# Patient Record
Sex: Female | Born: 1945 | Race: White | Hispanic: No | Smoking: Never smoker
Health system: Southern US, Community
[De-identification: ages and names within clinical notes are randomized; demographics above are authoritative.]

## PROBLEM LIST (undated history)

## (undated) DIAGNOSIS — I10 Essential (primary) hypertension: Secondary | ICD-10-CM

## (undated) DIAGNOSIS — E539 Vitamin B deficiency, unspecified: Secondary | ICD-10-CM

## (undated) DIAGNOSIS — K219 Gastro-esophageal reflux disease without esophagitis: Secondary | ICD-10-CM

## (undated) DIAGNOSIS — E119 Type 2 diabetes mellitus without complications: Secondary | ICD-10-CM

## (undated) DIAGNOSIS — G7000 Myasthenia gravis without (acute) exacerbation: Secondary | ICD-10-CM

## (undated) DIAGNOSIS — M4800 Spinal stenosis, site unspecified: Secondary | ICD-10-CM

## (undated) DIAGNOSIS — E785 Hyperlipidemia, unspecified: Secondary | ICD-10-CM

## (undated) DIAGNOSIS — R413 Other amnesia: Secondary | ICD-10-CM

## (undated) HISTORY — PX: CATARACT EXTRACTION: SUR2

---

## 2002-12-23 ENCOUNTER — Emergency Department (HOSPITAL_COMMUNITY): Admission: EM | Admit: 2002-12-23 | Discharge: 2002-12-23 | Payer: Self-pay

## 2003-01-21 ENCOUNTER — Encounter: Payer: Self-pay | Admitting: Diagnostic Radiology

## 2003-01-21 ENCOUNTER — Encounter: Admission: RE | Admit: 2003-01-21 | Discharge: 2003-01-21 | Payer: Self-pay | Admitting: Neurosurgery

## 2003-01-21 ENCOUNTER — Encounter: Payer: Self-pay | Admitting: Neurosurgery

## 2003-02-06 ENCOUNTER — Encounter: Payer: Self-pay | Admitting: Neurosurgery

## 2003-02-06 ENCOUNTER — Encounter: Admission: RE | Admit: 2003-02-06 | Discharge: 2003-02-06 | Payer: Self-pay | Admitting: Neurosurgery

## 2003-02-17 ENCOUNTER — Encounter: Payer: Self-pay | Admitting: Neurosurgery

## 2003-02-17 ENCOUNTER — Inpatient Hospital Stay (HOSPITAL_COMMUNITY): Admission: RE | Admit: 2003-02-17 | Discharge: 2003-02-20 | Payer: Self-pay | Admitting: Neurosurgery

## 2005-06-21 ENCOUNTER — Ambulatory Visit: Payer: Self-pay

## 2006-10-16 ENCOUNTER — Ambulatory Visit: Payer: Self-pay | Admitting: Anesthesiology

## 2008-02-20 ENCOUNTER — Ambulatory Visit: Payer: Self-pay | Admitting: Ophthalmology

## 2008-02-20 ENCOUNTER — Other Ambulatory Visit: Payer: Self-pay

## 2008-03-02 ENCOUNTER — Ambulatory Visit: Payer: Self-pay | Admitting: Ophthalmology

## 2010-04-06 ENCOUNTER — Ambulatory Visit: Payer: Self-pay | Admitting: Ophthalmology

## 2010-04-18 ENCOUNTER — Ambulatory Visit: Payer: Self-pay | Admitting: Ophthalmology

## 2012-01-01 ENCOUNTER — Emergency Department: Payer: Self-pay | Admitting: Emergency Medicine

## 2012-02-05 ENCOUNTER — Encounter: Payer: Self-pay | Admitting: Internal Medicine

## 2012-02-10 ENCOUNTER — Encounter: Payer: Self-pay | Admitting: Internal Medicine

## 2012-03-11 ENCOUNTER — Encounter: Payer: Self-pay | Admitting: Internal Medicine

## 2012-04-11 ENCOUNTER — Encounter: Payer: Self-pay | Admitting: Internal Medicine

## 2012-05-12 ENCOUNTER — Encounter: Payer: Self-pay | Admitting: Internal Medicine

## 2012-06-21 ENCOUNTER — Ambulatory Visit: Payer: Self-pay | Admitting: Internal Medicine

## 2012-06-21 DIAGNOSIS — Z0289 Encounter for other administrative examinations: Secondary | ICD-10-CM

## 2012-08-20 ENCOUNTER — Ambulatory Visit: Payer: Self-pay

## 2013-01-06 ENCOUNTER — Emergency Department: Payer: Self-pay | Admitting: Emergency Medicine

## 2013-01-06 LAB — URINALYSIS, COMPLETE
Bilirubin,UR: NEGATIVE
Blood: NEGATIVE
Ketone: NEGATIVE
Leukocyte Esterase: NEGATIVE
Nitrite: NEGATIVE
Ph: 8 (ref 4.5–8.0)
RBC,UR: 2 /HPF (ref 0–5)
Specific Gravity: 1.005 (ref 1.003–1.030)
Squamous Epithelial: 1

## 2013-01-06 LAB — MAGNESIUM: Magnesium: 1.2 mg/dL — ABNORMAL LOW

## 2013-01-06 LAB — CBC
HCT: 33.9 % — ABNORMAL LOW (ref 35.0–47.0)
HGB: 11.7 g/dL — ABNORMAL LOW (ref 12.0–16.0)
MCH: 29.2 pg (ref 26.0–34.0)
MCV: 85 fL (ref 80–100)
Platelet: 297 10*3/uL (ref 150–440)
WBC: 7.1 10*3/uL (ref 3.6–11.0)

## 2013-01-06 LAB — COMPREHENSIVE METABOLIC PANEL
Anion Gap: 8 (ref 7–16)
Calcium, Total: 9.1 mg/dL (ref 8.5–10.1)
Chloride: 98 mmol/L (ref 98–107)
Co2: 29 mmol/L (ref 21–32)
EGFR (African American): >60
EGFR (Non-African Amer.): >60
Glucose: 271 mg/dL — ABNORMAL HIGH (ref 65–99)
SGPT (ALT): 18 U/L (ref 12–78)
Sodium: 135 mmol/L — ABNORMAL LOW (ref 136–145)
Total Protein: 7.5 g/dL (ref 6.4–8.2)

## 2013-06-23 ENCOUNTER — Emergency Department: Payer: Self-pay | Admitting: Emergency Medicine

## 2013-06-23 LAB — BASIC METABOLIC PANEL
Anion Gap: 7 (ref 7–16)
Co2: 29 mmol/L (ref 21–32)
Creatinine: 0.87 mg/dL (ref 0.60–1.30)
EGFR (African American): >60
EGFR (Non-African Amer.): >60

## 2013-06-23 LAB — URINALYSIS, COMPLETE
Bacteria: NONE SEEN
Bilirubin,UR: NEGATIVE
Glucose,UR: >=500 mg/dL (ref 0–75)
Ph: 6 (ref 4.5–8.0)
RBC,UR: 1 /HPF (ref 0–5)
Specific Gravity: 1.009 (ref 1.003–1.030)
Squamous Epithelial: NONE SEEN
WBC UR: 1 /HPF (ref 0–5)

## 2013-06-23 LAB — CBC
HCT: 30.7 % — ABNORMAL LOW (ref 35.0–47.0)
RBC: 3.63 10*6/uL — ABNORMAL LOW (ref 3.80–5.20)
RDW: 13.4 % (ref 11.5–14.5)
WBC: 6.8 10*3/uL (ref 3.6–11.0)

## 2014-02-19 DIAGNOSIS — B351 Tinea unguium: Secondary | ICD-10-CM | POA: Insufficient documentation

## 2014-03-29 ENCOUNTER — Emergency Department: Payer: Self-pay | Admitting: Internal Medicine

## 2014-03-29 LAB — CBC WITH DIFFERENTIAL/PLATELET
Basophil #: 0 10*3/uL (ref 0.0–0.1)
Basophil %: 0.7 %
EOS PCT: 0.4 %
Eosinophil #: 0 10*3/uL (ref 0.0–0.7)
HCT: 36.4 % (ref 35.0–47.0)
HGB: 11.7 g/dL — AB (ref 12.0–16.0)
Lymphocyte #: 1.4 10*3/uL (ref 1.0–3.6)
Lymphocyte %: 20.9 %
MCH: 28.5 pg (ref 26.0–34.0)
MCHC: 32.1 g/dL (ref 32.0–36.0)
MCV: 89 fL (ref 80–100)
MONOS PCT: 13 %
Monocyte #: 0.9 x10 3/mm (ref 0.2–0.9)
Neutrophil #: 4.4 10*3/uL (ref 1.4–6.5)
Neutrophil %: 65 %
Platelet: 305 10*3/uL (ref 150–440)
RBC: 4.1 10*6/uL (ref 3.80–5.20)
RDW: 14.2 % (ref 11.5–14.5)
WBC: 6.8 10*3/uL (ref 3.6–11.0)

## 2014-03-29 LAB — COMPREHENSIVE METABOLIC PANEL
ALBUMIN: 3.6 g/dL (ref 3.4–5.0)
ALT: 18 U/L (ref 12–78)
ANION GAP: 12 (ref 7–16)
Alkaline Phosphatase: 66 U/L
BILIRUBIN TOTAL: 0.2 mg/dL (ref 0.2–1.0)
BUN: 12 mg/dL (ref 7–18)
CO2: 23 mmol/L (ref 21–32)
CREATININE: 1.1 mg/dL (ref 0.60–1.30)
Calcium, Total: 9.1 mg/dL (ref 8.5–10.1)
Chloride: 99 mmol/L (ref 98–107)
GFR CALC NON AF AMER: 52 — AB
Glucose: 133 mg/dL — ABNORMAL HIGH (ref 65–99)
Osmolality: 270 (ref 275–301)
Potassium: 4.2 mmol/L (ref 3.5–5.1)
SGOT(AST): 21 U/L (ref 15–37)
Sodium: 134 mmol/L — ABNORMAL LOW (ref 136–145)
Total Protein: 7.8 g/dL (ref 6.4–8.2)

## 2014-03-29 LAB — URINALYSIS, COMPLETE
BACTERIA: NONE SEEN
BLOOD: NEGATIVE
Bilirubin,UR: NEGATIVE
Glucose,UR: NEGATIVE mg/dL (ref 0–75)
KETONE: NEGATIVE
Nitrite: NEGATIVE
Ph: 5 (ref 4.5–8.0)
Protein: NEGATIVE
SPECIFIC GRAVITY: 1.017 (ref 1.003–1.030)
SQUAMOUS EPITHELIAL: NONE SEEN
WBC UR: 27 /HPF (ref 0–5)

## 2014-03-29 LAB — TROPONIN I: Troponin-I: <0.02 ng/mL

## 2014-04-20 ENCOUNTER — Emergency Department: Payer: Self-pay | Admitting: Student

## 2014-09-30 ENCOUNTER — Emergency Department: Payer: Self-pay | Admitting: Emergency Medicine

## 2014-09-30 LAB — COMPREHENSIVE METABOLIC PANEL
ALBUMIN: 3.4 g/dL (ref 3.4–5.0)
Alkaline Phosphatase: 60 U/L
Anion Gap: 10 (ref 7–16)
BILIRUBIN TOTAL: 0.2 mg/dL (ref 0.2–1.0)
BUN: 12 mg/dL (ref 7–18)
CALCIUM: 8.7 mg/dL (ref 8.5–10.1)
CREATININE: 0.96 mg/dL (ref 0.60–1.30)
Chloride: 100 mmol/L (ref 98–107)
Co2: 27 mmol/L (ref 21–32)
EGFR (African American): >60
EGFR (Non-African Amer.): >60
GLUCOSE: 114 mg/dL — AB (ref 65–99)
OSMOLALITY: 274 (ref 275–301)
Potassium: 3.7 mmol/L (ref 3.5–5.1)
SGOT(AST): 11 U/L — ABNORMAL LOW (ref 15–37)
SGPT (ALT): 14 U/L
Sodium: 137 mmol/L (ref 136–145)
Total Protein: 6.6 g/dL (ref 6.4–8.2)

## 2014-09-30 LAB — CBC
HCT: 31.5 % — ABNORMAL LOW (ref 35.0–47.0)
HGB: 10.2 g/dL — AB (ref 12.0–16.0)
MCH: 27.7 pg (ref 26.0–34.0)
MCHC: 32.6 g/dL (ref 32.0–36.0)
MCV: 85 fL (ref 80–100)
PLATELETS: 371 10*3/uL (ref 150–440)
RBC: 3.7 10*6/uL — ABNORMAL LOW (ref 3.80–5.20)
RDW: 14 % (ref 11.5–14.5)
WBC: 11.8 10*3/uL — AB (ref 3.6–11.0)

## 2014-09-30 LAB — URINALYSIS, COMPLETE
BILIRUBIN, UR: NEGATIVE
Bacteria: NONE SEEN
Blood: NEGATIVE
Glucose,UR: NEGATIVE mg/dL (ref 0–75)
LEUKOCYTE ESTERASE: NEGATIVE
Nitrite: NEGATIVE
PH: 7 (ref 4.5–8.0)
RBC, UR: NONE SEEN /HPF (ref 0–5)
Specific Gravity: 1.012 (ref 1.003–1.030)
Squamous Epithelial: <1
WBC UR: <1 /HPF (ref 0–5)

## 2014-11-03 ENCOUNTER — Ambulatory Visit: Payer: Self-pay

## 2015-04-15 ENCOUNTER — Emergency Department: Payer: Medicare Other

## 2015-04-15 ENCOUNTER — Encounter: Payer: Self-pay | Admitting: Emergency Medicine

## 2015-04-15 ENCOUNTER — Observation Stay
Admission: EM | Admit: 2015-04-15 | Discharge: 2015-04-16 | Disposition: A | Discharge status: Skilled Nursing Facility | Payer: Medicare Other | Attending: Internal Medicine | Admitting: Internal Medicine

## 2015-04-15 DIAGNOSIS — R4182 Altered mental status, unspecified: Secondary | ICD-10-CM | POA: Diagnosis not present

## 2015-04-15 DIAGNOSIS — M4800 Spinal stenosis, site unspecified: Secondary | ICD-10-CM | POA: Insufficient documentation

## 2015-04-15 DIAGNOSIS — I2510 Atherosclerotic heart disease of native coronary artery without angina pectoris: Secondary | ICD-10-CM | POA: Diagnosis not present

## 2015-04-15 DIAGNOSIS — E119 Type 2 diabetes mellitus without complications: Secondary | ICD-10-CM | POA: Diagnosis not present

## 2015-04-15 DIAGNOSIS — W010XXA Fall on same level from slipping, tripping and stumbling without subsequent striking against object, initial encounter: Secondary | ICD-10-CM | POA: Diagnosis not present

## 2015-04-15 DIAGNOSIS — Z981 Arthrodesis status: Secondary | ICD-10-CM | POA: Insufficient documentation

## 2015-04-15 DIAGNOSIS — S42002A Fracture of unspecified part of left clavicle, initial encounter for closed fracture: Secondary | ICD-10-CM | POA: Diagnosis present

## 2015-04-15 DIAGNOSIS — G94 Other disorders of brain in diseases classified elsewhere: Secondary | ICD-10-CM | POA: Insufficient documentation

## 2015-04-15 DIAGNOSIS — K219 Gastro-esophageal reflux disease without esophagitis: Secondary | ICD-10-CM | POA: Diagnosis not present

## 2015-04-15 DIAGNOSIS — M25512 Pain in left shoulder: Secondary | ICD-10-CM | POA: Diagnosis not present

## 2015-04-15 DIAGNOSIS — Z79899 Other long term (current) drug therapy: Secondary | ICD-10-CM | POA: Diagnosis not present

## 2015-04-15 DIAGNOSIS — E539 Vitamin B deficiency, unspecified: Secondary | ICD-10-CM | POA: Diagnosis not present

## 2015-04-15 DIAGNOSIS — R413 Other amnesia: Secondary | ICD-10-CM | POA: Insufficient documentation

## 2015-04-15 DIAGNOSIS — Z9851 Tubal ligation status: Secondary | ICD-10-CM | POA: Insufficient documentation

## 2015-04-15 DIAGNOSIS — Z794 Long term (current) use of insulin: Secondary | ICD-10-CM | POA: Diagnosis not present

## 2015-04-15 DIAGNOSIS — G7000 Myasthenia gravis without (acute) exacerbation: Secondary | ICD-10-CM | POA: Insufficient documentation

## 2015-04-15 DIAGNOSIS — M25511 Pain in right shoulder: Secondary | ICD-10-CM | POA: Insufficient documentation

## 2015-04-15 DIAGNOSIS — I739 Peripheral vascular disease, unspecified: Secondary | ICD-10-CM | POA: Diagnosis not present

## 2015-04-15 DIAGNOSIS — Z9849 Cataract extraction status, unspecified eye: Secondary | ICD-10-CM | POA: Diagnosis not present

## 2015-04-15 DIAGNOSIS — Y9289 Other specified places as the place of occurrence of the external cause: Secondary | ICD-10-CM | POA: Insufficient documentation

## 2015-04-15 DIAGNOSIS — N281 Cyst of kidney, acquired: Secondary | ICD-10-CM | POA: Insufficient documentation

## 2015-04-15 DIAGNOSIS — E785 Hyperlipidemia, unspecified: Secondary | ICD-10-CM | POA: Diagnosis not present

## 2015-04-15 DIAGNOSIS — R000 Tachycardia, unspecified: Secondary | ICD-10-CM | POA: Diagnosis not present

## 2015-04-15 DIAGNOSIS — S42022A Displaced fracture of shaft of left clavicle, initial encounter for closed fracture: Secondary | ICD-10-CM | POA: Diagnosis not present

## 2015-04-15 DIAGNOSIS — G9340 Encephalopathy, unspecified: Secondary | ICD-10-CM | POA: Diagnosis present

## 2015-04-15 DIAGNOSIS — I10 Essential (primary) hypertension: Secondary | ICD-10-CM | POA: Insufficient documentation

## 2015-04-15 DIAGNOSIS — S42211A Unspecified displaced fracture of surgical neck of right humerus, initial encounter for closed fracture: Secondary | ICD-10-CM | POA: Diagnosis not present

## 2015-04-15 DIAGNOSIS — Z7982 Long term (current) use of aspirin: Secondary | ICD-10-CM | POA: Insufficient documentation

## 2015-04-15 DIAGNOSIS — F0390 Unspecified dementia without behavioral disturbance: Secondary | ICD-10-CM | POA: Diagnosis not present

## 2015-04-15 DIAGNOSIS — I471 Supraventricular tachycardia: Secondary | ICD-10-CM | POA: Diagnosis present

## 2015-04-15 DIAGNOSIS — S42301A Unspecified fracture of shaft of humerus, right arm, initial encounter for closed fracture: Secondary | ICD-10-CM | POA: Diagnosis present

## 2015-04-15 HISTORY — DX: Gastro-esophageal reflux disease without esophagitis: K21.9

## 2015-04-15 HISTORY — DX: Spinal stenosis, site unspecified: M48.00

## 2015-04-15 HISTORY — DX: Myasthenia gravis without (acute) exacerbation: G70.00

## 2015-04-15 HISTORY — DX: Hyperlipidemia, unspecified: E78.5

## 2015-04-15 HISTORY — DX: Essential (primary) hypertension: I10

## 2015-04-15 HISTORY — DX: Type 2 diabetes mellitus without complications: E11.9

## 2015-04-15 HISTORY — DX: Other amnesia: R41.3

## 2015-04-15 HISTORY — DX: Vitamin B deficiency, unspecified: E53.9

## 2015-04-15 LAB — CBC WITH DIFFERENTIAL/PLATELET
BASOS ABS: 0 10*3/uL (ref 0–0.1)
Basophils Relative: 0 %
EOS ABS: 0 10*3/uL (ref 0–0.7)
Eosinophils Relative: 0 %
HEMATOCRIT: 30.4 % — AB (ref 35.0–47.0)
HEMOGLOBIN: 10.2 g/dL — AB (ref 12.0–16.0)
LYMPHS ABS: 0.7 10*3/uL — AB (ref 1.0–3.6)
Lymphocytes Relative: 5 %
MCH: 28.6 pg (ref 26.0–34.0)
MCHC: 33.5 g/dL (ref 32.0–36.0)
MCV: 85.3 fL (ref 80.0–100.0)
Monocytes Absolute: 0.9 10*3/uL (ref 0.2–0.9)
Monocytes Relative: 6 %
NEUTROS ABS: 12.1 10*3/uL — AB (ref 1.4–6.5)
NEUTROS PCT: 89 %
Platelets: 322 10*3/uL (ref 150–440)
RBC: 3.56 MIL/uL — AB (ref 3.80–5.20)
RDW: 15.5 % — ABNORMAL HIGH (ref 11.5–14.5)
WBC: 13.7 10*3/uL — ABNORMAL HIGH (ref 3.6–11.0)

## 2015-04-15 LAB — COMPREHENSIVE METABOLIC PANEL
ALT: 14 U/L (ref 14–54)
AST: 31 U/L (ref 15–41)
Albumin: 3.8 g/dL (ref 3.5–5.0)
Alkaline Phosphatase: 49 U/L (ref 38–126)
Anion gap: 14 (ref 5–15)
BUN: 17 mg/dL (ref 6–20)
CALCIUM: 9 mg/dL (ref 8.9–10.3)
CO2: 24 mmol/L (ref 22–32)
CREATININE: 0.73 mg/dL (ref 0.44–1.00)
Chloride: 96 mmol/L — ABNORMAL LOW (ref 101–111)
Glucose, Bld: 235 mg/dL — ABNORMAL HIGH (ref 65–99)
Potassium: 4.8 mmol/L (ref 3.5–5.1)
SODIUM: 134 mmol/L — AB (ref 135–145)
Total Bilirubin: 0.2 mg/dL — ABNORMAL LOW (ref 0.3–1.2)
Total Protein: 6.6 g/dL (ref 6.5–8.1)

## 2015-04-15 LAB — URINALYSIS COMPLETE WITH MICROSCOPIC (ARMC ONLY)
BACTERIA UA: NONE SEEN
BILIRUBIN URINE: NEGATIVE
Glucose, UA: >500 mg/dL — AB
Hgb urine dipstick: NEGATIVE
Leukocytes, UA: NEGATIVE
NITRITE: NEGATIVE
PH: 6 (ref 5.0–8.0)
PROTEIN: NEGATIVE mg/dL
SQUAMOUS EPITHELIAL / LPF: NONE SEEN
Specific Gravity, Urine: 1.016 (ref 1.005–1.030)

## 2015-04-15 LAB — TROPONIN I: Troponin I: <0.03 ng/mL (ref <0.031)

## 2015-04-15 LAB — TSH: TSH: 0.982 u[IU]/mL (ref 0.350–4.500)

## 2015-04-15 LAB — GLUCOSE, CAPILLARY: Glucose-Capillary: 192 mg/dL — ABNORMAL HIGH (ref 65–99)

## 2015-04-15 LAB — FIBRIN DERIVATIVES D-DIMER (ARMC ONLY): Fibrin derivatives D-dimer (ARMC): 5896 — ABNORMAL HIGH (ref 0–499)

## 2015-04-15 MED ORDER — ONDANSETRON HCL 4 MG/2ML IJ SOLN: 4.0000 mg | Freq: Four times a day (QID) | INTRAMUSCULAR | Status: DC | PRN

## 2015-04-15 MED ORDER — IOHEXOL 300 MG/ML  SOLN
100.0000 mL | Freq: Once | INTRAMUSCULAR | Status: AC | PRN
  Administered 2015-04-15: 100 mL via INTRAVENOUS

## 2015-04-15 MED ORDER — METFORMIN HCL 500 MG PO TABS
1000.0000 mg | ORAL_TABLET | Freq: Two times a day (BID) | ORAL | Status: DC
  Administered 2015-04-16: 1000 mg via ORAL
  Filled 2015-04-15: qty 2

## 2015-04-15 MED ORDER — LABETALOL HCL 5 MG/ML IV SOLN
10.0000 mg | INTRAVENOUS | Status: DC | PRN
  Filled 2015-04-15: qty 4

## 2015-04-15 MED ORDER — SODIUM CHLORIDE 0.9 % IV SOLN
INTRAVENOUS | Status: AC
  Administered 2015-04-16: via INTRAVENOUS

## 2015-04-15 MED ORDER — ACETAMINOPHEN 325 MG PO TABS
650.0000 mg | ORAL_TABLET | Freq: Four times a day (QID) | ORAL | Status: DC | PRN
  Administered 2015-04-16: 650 mg via ORAL
  Filled 2015-04-15: qty 2

## 2015-04-15 MED ORDER — MORPHINE SULFATE 2 MG/ML IJ SOLN
2.0000 mg | INTRAMUSCULAR | Status: DC | PRN
  Administered 2015-04-16 (×3): 2 mg via INTRAVENOUS
  Filled 2015-04-15 (×3): qty 1

## 2015-04-15 MED ORDER — ONDANSETRON HCL 4 MG PO TABS: 4.0000 mg | ORAL_TABLET | Freq: Four times a day (QID) | ORAL | Status: DC | PRN

## 2015-04-15 MED ORDER — IOHEXOL 350 MG/ML SOLN
75.0000 mL | Freq: Once | INTRAVENOUS | Status: AC | PRN
  Administered 2015-04-15: 75 mL via INTRAVENOUS

## 2015-04-15 MED ORDER — IOHEXOL 240 MG/ML SOLN: 25.0000 mL | Freq: Once | INTRAMUSCULAR | Status: DC | PRN

## 2015-04-15 MED ORDER — INSULIN ASPART 100 UNIT/ML ~~LOC~~ SOLN
0.0000 [IU] | Freq: Three times a day (TID) | SUBCUTANEOUS | Status: DC
  Administered 2015-04-16: 2 [IU] via SUBCUTANEOUS
  Administered 2015-04-16: 3 [IU] via SUBCUTANEOUS
  Filled 2015-04-15: qty 2
  Filled 2015-04-15: qty 3

## 2015-04-15 MED ORDER — LISINOPRIL 10 MG PO TABS
10.0000 mg | ORAL_TABLET | Freq: Every day | ORAL | Status: DC
  Administered 2015-04-16: 10 mg via ORAL
  Filled 2015-04-15: qty 1

## 2015-04-15 MED ORDER — MORPHINE SULFATE 2 MG/ML IJ SOLN
2.0000 mg | Freq: Once | INTRAMUSCULAR | Status: AC
  Administered 2015-04-15: 2 mg via INTRAVENOUS
  Filled 2015-04-15: qty 1

## 2015-04-15 MED ORDER — SODIUM CHLORIDE 0.9 % IV BOLUS (SEPSIS)
1000.0000 mL | Freq: Once | INTRAVENOUS | Status: AC
  Administered 2015-04-15: 1000 mL via INTRAVENOUS

## 2015-04-15 MED ORDER — MORPHINE SULFATE 4 MG/ML IJ SOLN
4.0000 mg | Freq: Once | INTRAMUSCULAR | Status: AC
Start: 2015-04-15 — End: 2015-04-15
  Administered 2015-04-15: 4 mg via INTRAVENOUS
  Filled 2015-04-15: qty 1

## 2015-04-15 MED ORDER — SODIUM CHLORIDE 0.9 % IJ SOLN: 3.0000 mL | Freq: Two times a day (BID) | INTRAMUSCULAR | Status: DC

## 2015-04-15 MED ORDER — INSULIN GLARGINE 100 UNIT/ML ~~LOC~~ SOLN
6.0000 [IU] | Freq: Every day | SUBCUTANEOUS | Status: DC
  Administered 2015-04-16: 6 [IU] via SUBCUTANEOUS
  Filled 2015-04-15 (×2): qty 0.06

## 2015-04-15 MED ORDER — ENOXAPARIN SODIUM 40 MG/0.4ML ~~LOC~~ SOLN
40.0000 mg | SUBCUTANEOUS | Status: DC
  Administered 2015-04-16: 40 mg via SUBCUTANEOUS
  Filled 2015-04-15: qty 0.4

## 2015-04-15 MED ORDER — CITALOPRAM HYDROBROMIDE 20 MG PO TABS
20.0000 mg | ORAL_TABLET | Freq: Every day | ORAL | Status: DC
  Administered 2015-04-16: 20 mg via ORAL
  Filled 2015-04-15: qty 1

## 2015-04-15 MED ORDER — ASPIRIN 81 MG PO CHEW
81.0000 mg | CHEWABLE_TABLET | Freq: Every day | ORAL | Status: DC
  Administered 2015-04-16: 81 mg via ORAL
  Filled 2015-04-15: qty 1

## 2015-04-15 MED ORDER — SODIUM CHLORIDE 0.9 % IV BOLUS (SEPSIS)
500.0000 mL | Freq: Once | INTRAVENOUS | Status: AC
  Administered 2015-04-15: 500 mL via INTRAVENOUS

## 2015-04-15 MED ORDER — ACETAMINOPHEN 650 MG RE SUPP: 650.0000 mg | Freq: Four times a day (QID) | RECTAL | Status: DC | PRN

## 2015-04-15 MED ORDER — DONEPEZIL HCL 5 MG PO TABS
5.0000 mg | ORAL_TABLET | Freq: Every day | ORAL | Status: DC
  Administered 2015-04-16: 5 mg via ORAL
  Filled 2015-04-15: qty 1

## 2015-04-15 MED ORDER — PANTOPRAZOLE SODIUM 40 MG PO TBEC
40.0000 mg | DELAYED_RELEASE_TABLET | Freq: Every day | ORAL | Status: DC
  Administered 2015-04-16: 40 mg via ORAL
  Filled 2015-04-15: qty 1

## 2015-04-15 NOTE — ED Provider Notes (Signed)
[facility] Emergency Department Provider Note  ____________________________________________  Time seen: Seen upon arrival to the emergency department  I have reviewed the triage vital signs and the nursing notes.   HISTORY  Chief Complaint Shoulder Injury    HPI Robin Archer is a 69 y.o. female with a history of memory losswho presents today with a confirmed left midclavicular fracture. EMS states that the patient started complaining of left shoulder pain after being moved today. The patient denies any fall. There was no history of a fall per EMS.   Past Medical History  Diagnosis Date  . Memory loss   . GERD (gastroesophageal reflux disease)   . Myasthenia gravis   . DM (diabetes mellitus)   . Hypertension   . Hyperlipidemia   . Spinal stenosis   . Vitamin B deficiency     There are no active problems to display for this patient.   History reviewed. No pertinent past surgical history.  Current Outpatient Rx  Name  Route  Sig  Dispense  Refill  . acetaminophen (TYLENOL) 325 MG tablet   Oral   Take 650 mg by mouth 3 (three) times daily.         Marland Kitchen alendronate (FOSAMAX) 70 MG tablet   Oral   Take 70 mg by mouth once a week. Pt takes on Tuesday.         Marland Kitchen aspirin 81 MG chewable tablet   Oral   Chew 81 mg by mouth daily.         . cholecalciferol (VITAMIN D) 1000 UNITS tablet   Oral   Take 1,000 Units by mouth daily.         . citalopram (CELEXA) 20 MG tablet   Oral   Take 20 mg by mouth daily.         Marland Kitchen donepezil (ARICEPT) 5 MG tablet   Oral   Take 5 mg by mouth at bedtime.         . ferrous sulfate 325 (65 FE) MG tablet   Oral   Take 325 mg by mouth 2 (two) times daily.         Marland Kitchen gabapentin (NEURONTIN) 300 MG capsule   Oral   Take 300 mg by mouth at bedtime.         . insulin glargine (LANTUS) 100 UNIT/ML injection   Subcutaneous   Inject 6 Units into the skin at bedtime.         . insulin lispro  (HUMALOG) 100 UNIT/ML injection   Subcutaneous   Inject 1-4 Units into the skin 2 (two) times daily. Pt uses four units in the morning before breakfast, two units before lunch, and one unit before dinner.         Marland Kitchen lisinopril (PRINIVIL,ZESTRIL) 10 MG tablet   Oral   Take 10 mg by mouth daily.         . magnesium oxide (MAG-OX) 400 MG tablet   Oral   Take 800 mg by mouth 2 (two) times daily.         . metFORMIN (GLUCOPHAGE) 1000 MG tablet   Oral   Take 1,000 mg by mouth 2 (two) times daily.         Marland Kitchen omeprazole (PRILOSEC) 20 MG capsule   Oral   Take 20 mg by mouth daily.         . vitamin B-12 (CYANOCOBALAMIN) 1000 MCG tablet   Oral   Take 1,000 mcg by mouth  every other day.         . vitamin C (ASCORBIC ACID) 500 MG tablet   Oral   Take 500 mg by mouth daily.           Allergies Review of patient's allergies indicates no known allergies.  History reviewed. No pertinent family history.  Social History History  Substance Use Topics  . Smoking status: Unknown If Ever Smoked  . Smokeless tobacco: Not on file  . Alcohol Use: No    Review of Systems Constitutional: No fever/chills Eyes: No visual changes. ENT: No sore throat. Cardiovascular: Denies chest pain. Respiratory: Denies shortness of breath. Gastrointestinal: No abdominal pain.  No nausea, no vomiting.  No diarrhea.  No constipation. Genitourinary: Negative for dysuria. Musculoskeletal: Negative for back pain. Skin: Negative for rash. Neurological: Negative for headaches, focal weakness or numbness.  10-point ROS otherwise negative.  ____________________________________________   PHYSICAL EXAM:  VITAL SIGNS: ED Triage Vitals  Enc Vitals Group     BP 04/15/15 1506 202/99 mmHg     Pulse Rate 04/15/15 1506 125     Resp 04/15/15 1506 14     Temp 04/15/15 1506 97.9 F (36.6 C)     Temp Source 04/15/15 1506 Oral     SpO2 04/15/15 1506 96 %     Weight 04/15/15 1506 130 lb 1.6 oz  (59.013 kg)     Height 04/15/15 1506  (1.626 m)     Head Cir --      Peak Flow --      Pain Score --      Pain Loc --      Pain Edu? --      Excl. in GC? --     Constitutional: Alert and oriented. Well appearing and in no acute distress. Eyes: Conjunctivae are normal. PERRL. EOMI. Head: Atraumatic. Nose: No congestion/rhinnorhea. Mouth/Throat: Mucous membranes are moist.  Oropharynx non-erythematous. Neck: No stridor.  No midline C-spine tenderness to palpation. No step-off or deformity of the cervical spine. Cardiovascular: Normal rate, regular rhythm. Grossly normal heart sounds.  Good peripheral circulation. Respiratory: Normal respiratory effort.  No retractions. Lungs CTAB. Gastrointestinal: Soft and nontender. No distention. No abdominal bruits. No CVA tenderness. Musculoskeletal: No lower extremity tenderness nor edema.  No joint effusions. No skin tenting over the left clavicle. No ecchymosis or deformity. Diffuse tenderness to the bilateral shoulders without any point tenderness. Neurologic:  Normal speech and language. No gross focal neurologic deficits are appreciated.  Skin:  Skin is warm, dry and intact. No rash noted. Psychiatric: Mood and affect are normal. Speech and behavior are normal.  ____________________________________________   LABS (all labs ordered are listed, but only abnormal results are displayed)  Labs Reviewed  CBC WITH DIFFERENTIAL/PLATELET - Abnormal; Notable for the following:    WBC 13.7 (*)    RBC 3.56 (*)    Hemoglobin 10.2 (*)    HCT 30.4 (*)    RDW 15.5 (*)    Neutro Abs 12.1 (*)    Lymphs Abs 0.7 (*)    All other components within normal limits  URINALYSIS COMPLETEWITH MICROSCOPIC ([facility] ONLY) - Abnormal; Notable for the following:    Color, Urine YELLOW (*)    APPearance CLEAR (*)    Glucose, UA >500 (*)    Ketones, ur 1+ (*)    All other components within normal limits  COMPREHENSIVE METABOLIC PANEL - Abnormal; Notable for the  following:    Sodium 134 (*)    Chloride  96 (*)    Glucose, Bld 235 (*)    Total Bilirubin 0.2 (*)    All other components within normal limits  FIBRIN DERIVATIVES D-DIMER ([facility] ONLY) - Abnormal; Notable for the following:    Fibrin derivatives D-dimer [facility]) 5896 (*)    All other components within normal limits  TROPONIN I  TSH   ____________________________________________  EKG  ED ECG REPORT I, Arelia Longest, the attending physician, personally viewed and interpreted this ECG.   Date: 04/15/2015  EKG Time: 1529  Rate: 116  Rhythm: sinus tachycardia  Axis: Normal axis  Intervals:none  ST&T Change: No ST elevations or depressions. No abnormal T-wave inversions.  ____________________________________________  RADIOLOGY   CT aNGO without any pulmonary embolus. Acute surgical neck fracture of the right humerus. No acute finding on the CAT scan of the abdomen and pelvis. CAT scan of the brain with atrophy and small vessel disease without any acute abnormality. CT of the cervical spine without any acute disease. Chest x-ray with a midshaft left clavicular fracture. I personally reviewed this chest x-ray. Pelvic x-ray without any acute abnormality. ____________________________________________   PROCEDURES    ____________________________________________   INITIAL IMPRESSION / ASSESSMENT AND PLAN / ED COURSE  Pertinent labs & imaging results that were available during my care of the patient were reviewed by me and considered in my medical decision making (see chart for details).  ----------------------------------------- 9:31 PM on 04/15/2015 ----------------------------------------- Family also saying now that the patient fell from her lift which is what caused the injury. Patient without any overt signs of pain. He is resting comfortably in the bed. However, heart rate is persistently in the 1 teens to 120s. It is maintaining this elevated rate despite the patient  getting one and 1/2 L of fluid. Family is at the bedside and says that the patient is also not acting at her baseline mental status. They say that she is usually more responsive and alert. Patient is protecting her airway as well as answering simple questions with yes no questions as well as nodding her head yes and no. We'll admit to the hospital for altered mental status. Signed out to Dr. Anne Hahn. Also discussed the fractures with Dr. Hyacinth Meeker of orthopedics who says that they do not require any emergent intervention. ____________________________________________   FINAL CLINICAL IMPRESSION(S) / ED DIAGNOSES  Acute left clavicular fracture. Acute right humeral fracture. Altered mental status. Persistent tachycardia. Initial visit.    Myrna Blazer, MD 04/15/15 2133

## 2015-04-15 NOTE — ED Notes (Addendum)
Pt presents from [facility] [place] with a confirmed L mid-clavicular fx per EMS. EMS states they did a portable x-ray of L shoudler after they were moving her today and she began to complain of shoulder pain. Pt is poor historian d/t memory loss.

## 2015-04-15 NOTE — H&P (Signed)
[facility] [place] [facility]   PATIENT NAME: Robin Archer    MR#:  [id number]  DATE OF BIRTH:  10-03-1945  DATE OF ADMISSION:  04/15/2015  PRIMARY CARE PHYSICIAN: Wynona Dove, MD   REQUESTING/REFERRING PHYSICIAN: Schaevitz  CHIEF COMPLAINT:   Chief Complaint  Patient presents with  . Shoulder Injury    HISTORY OF PRESENT ILLNESS:  Robin Archer  is a 69 y.o. female who presents with traumatic fractures from a fall. Patient is unable to give history, history is given by the nursing facility and her daughter who is present with her in the ED today. Per her daughter's report she was called by the nursing sleeve and the patient fell while being lifted with a lift device. The nursing home claims that the patient slipped from the handgrips and fell. In ED patient was found to have a right humeral fracture and a left clavicular fracture. She was also found to be somewhat encephalopathic. She is alert but unable to communicate to any significant degree, though she does follow commands and will respond to simple questions. The daughter states she has baseline dementia, but that this is significant change from her baseline functioning status. Patient is also remained persistently tachycardic and hypertensive in the ED. Hospitalists were called for admission for encephalopathy and persistent tachycardia.  PAST MEDICAL HISTORY:   Past Medical History  Diagnosis Date  . Memory loss   . GERD (gastroesophageal reflux disease)   . Myasthenia gravis   . DM (diabetes mellitus)   . Hypertension   . Hyperlipidemia   . Spinal stenosis   . Vitamin B deficiency     PAST SURGICAL HISTORY:   Past Surgical History  Procedure Laterality Date  . Cataract extraction      SOCIAL HISTORY:   History  Substance Use Topics  . Smoking status: Never Smoker   . Smokeless tobacco: Not on file  . Alcohol Use: No    FAMILY HISTORY:  History reviewed. No pertinent  family history.  DRUG ALLERGIES:  No Known Allergies  MEDICATIONS AT HOME:   Prior to Admission medications   Medication Sig Start Date End Date Taking? Authorizing Provider  acetaminophen (TYLENOL) 325 MG tablet Take 650 mg by mouth 3 (three) times daily.   Yes Historical Provider, MD  alendronate (FOSAMAX) 70 MG tablet Take 70 mg by mouth once a week. Pt takes on Tuesday.   Yes Historical Provider, MD  aspirin 81 MG chewable tablet Chew 81 mg by mouth daily.   Yes Historical Provider, MD  cholecalciferol (VITAMIN D) 1000 UNITS tablet Take 1,000 Units by mouth daily.   Yes Historical Provider, MD  citalopram (CELEXA) 20 MG tablet Take 20 mg by mouth daily.   Yes Historical Provider, MD  donepezil (ARICEPT) 5 MG tablet Take 5 mg by mouth at bedtime.   Yes Historical Provider, MD  ferrous sulfate 325 (65 FE) MG tablet Take 325 mg by mouth 2 (two) times daily.   Yes Historical Provider, MD  gabapentin (NEURONTIN) 300 MG capsule Take 300 mg by mouth at bedtime.   Yes Historical Provider, MD  insulin glargine (LANTUS) 100 UNIT/ML injection Inject 6 Units into the skin at bedtime.   Yes Historical Provider, MD  insulin lispro (HUMALOG) 100 UNIT/ML injection Inject 1-4 Units into the skin 2 (two) times daily. Pt uses four units in the morning before breakfast, two units before lunch, and one unit before dinner.   Yes Historical Provider, MD  lisinopril (PRINIVIL,ZESTRIL) 10 MG tablet Take 10 mg by mouth daily.   Yes Historical Provider, MD  magnesium oxide (MAG-OX) 400 MG tablet Take 800 mg by mouth 2 (two) times daily.   Yes Historical Provider, MD  metFORMIN (GLUCOPHAGE) 1000 MG tablet Take 1,000 mg by mouth 2 (two) times daily.   Yes Historical Provider, MD  omeprazole (PRILOSEC) 20 MG capsule Take 20 mg by mouth daily.   Yes Historical Provider, MD  vitamin B-12 (CYANOCOBALAMIN) 1000 MCG tablet Take 1,000 mcg by mouth every other day.   Yes Historical Provider, MD  vitamin C (ASCORBIC ACID) 500  MG tablet Take 500 mg by mouth daily.   Yes Historical Provider, MD    REVIEW OF SYSTEMS:  Review of Systems  Unable to perform ROS: dementia     VITAL SIGNS:   Filed Vitals:   04/15/15 1506 04/15/15 2232  BP: 202/99 189/81  Pulse: 125 125  Temp: 97.9 F (36.6 C)   TempSrc: Oral   Resp: 14 17  Height: 5\' 4"  (1.626 m)   Weight: 59.013 kg (130 lb 1.6 oz)   SpO2: 96% 97%   Wt Readings from Last 3 Encounters:  04/15/15 59.013 kg (130 lb 1.6 oz)    PHYSICAL EXAMINATION:  Physical Exam  Vitals reviewed. Constitutional: She is oriented to person, place, and time. She appears well-developed and well-nourished. No distress.  HENT:  Head: Normocephalic and atraumatic.  Mouth/Throat: Oropharynx is clear and moist.  Eyes: Conjunctivae and EOM are normal. Pupils are equal, round, and reactive to light. No scleral icterus.  Neck: Normal range of motion. Neck supple. No JVD present. No thyromegaly present.  Cardiovascular: Regular rhythm and intact distal pulses.  Exam reveals no gallop and no friction rub.   No murmur heard. Tachycardic  Respiratory: Effort normal and breath sounds normal. No respiratory distress. She has no wheezes. She has no rales.  GI: Soft. Bowel sounds are normal. She exhibits no distension. There is no tenderness.  Musculoskeletal: Normal range of motion. She exhibits no edema.  Left arm in sling, right arm with brace device in place. Right upper arm tender. No arthritis, no gout  Lymphadenopathy:    She has no cervical adenopathy.  Neurological: She is alert and oriented to person, place, and time. No cranial nerve deficit.  No dysarthria, no aphasia  Skin: Skin is warm and dry. No rash noted. No erythema.  Psychiatric: She has a normal mood and affect. Her behavior is normal. Judgment and thought content normal.    LABORATORY PANEL:   CBC  Recent Labs Lab 04/15/15 1549  WBC 13.7*  HGB 10.2*  HCT 30.4*  PLT 322    ------------------------------------------------------------------------------------------------------------------  Chemistries   Recent Labs Lab 04/15/15 1549  NA 134*  K 4.8  CL 96*  CO2 24  GLUCOSE 235*  BUN 17  CREATININE 0.73  CALCIUM 9.0  AST 31  ALT 14  ALKPHOS 49  BILITOT 0.2*   ------------------------------------------------------------------------------------------------------------------  Cardiac Enzymes  Recent Labs Lab 04/15/15 1549  TROPONINI <0.03   ------------------------------------------------------------------------------------------------------------------  RADIOLOGY:  Dg Chest 1 View  04/15/2015   CLINICAL DATA:  Possible fall.  Left clavicle fracture.  EXAM: CHEST  1 VIEW  COMPARISON:  01/06/2013  FINDINGS: The cardiomediastinal silhouette is unchanged and within normal limits. The lungs are mildly hypoinflated with minimal bibasilar atelectasis. No pleural effusion or pneumothorax is identified. There is a mildly-to-moderately displaced midshaft fracture of the left clavicle. There is also a mildly displaced right  humeral neck fracture.  IMPRESSION: 1. Midshaft left clavicle fracture. 2. Mildly displaced proximal right humerus fracture, incompletely evaluated. 3. Hypoinflation with minimal bibasilar atelectasis.   Electronically Signed   By: Sebastian Ache   On: 04/15/2015 16:09   Dg Pelvis 1-2 Views  04/15/2015   CLINICAL DATA:  Possible fall. Poor historian. Possible clavicle fracture.  EXAM: PELVIS - 1-2 VIEW  COMPARISON:  None.  FINDINGS: There is no evidence of pelvic fracture or diastasis. No pelvic bone lesions are seen.  Previous lumbar fusion. Bilateral tubal ligation clips are present. Bones appear osteopenic. Regional bowel gas pattern is nonobstructive.  IMPRESSION: No evidence for acute  abnormality.   Electronically Signed   By: Norva Pavlov M.D.   On: 04/15/2015 16:07   Ct Head Wo Contrast  04/15/2015   CLINICAL DATA:  The patient is  from [facility] [place] with a confirmed L mid-clavicular fx per EMS. EMS states they did a portable x-ray of L shoulder after they were moving her today and she began to complain of shoulder pain. Pt is poor historian due to memory loss.  EXAM: CT HEAD WITHOUT CONTRAST  CT CERVICAL SPINE WITHOUT CONTRAST  TECHNIQUE: Multidetector CT imaging of the head and cervical spine was performed following the standard protocol without intravenous contrast. Multiplanar CT image reconstructions of the cervical spine were also generated.  COMPARISON:  11/03/2014  FINDINGS: CT HEAD FINDINGS  There is moderate central and cortical atrophy. Periventricular white matter changes are consistent with small vessel disease. There is no intra or extra-axial fluid collection or mass lesion. The basilar cisterns and ventricles have a normal appearance. There is no CT evidence for acute infarction or hemorrhage. Bone windows show no acute fractures. There is mild edema in the left frontal scalp region. No underlying fracture.  CT CERVICAL SPINE FINDINGS  There are significant degenerative changes throughout the cervical spine. There are no acute cervical fractures or traumatic subluxation. Lung apices are clear. The visualized portion of the thyroid gland has a normal appearance. Coarse ligamentum flavum calcifications are again noted at C4 and C5. There is mild narrowing of the thecal sac at these levels, stable in appearance.  There is a fracture of the left clavicle with inferior displacement of the distal fragment. Upper posterior ribs are intact.  IMPRESSION: 1. Atrophy and small vessel disease. 2.  No evidence for acute  abnormality. 3. Left frontal scalp edema without underlying fracture. 4. Significant degenerative changes in the cervical spine. 5. Left clavicle fracture.   Electronically Signed   By: Norva Pavlov M.D.   On: 04/15/2015 16:48   Ct Angio Chest Pe W/cm &/or Wo Cm  04/15/2015   CLINICAL DATA:   Tachycardia and elevated D-dimer today. Acute right humerus and left clavicle fractures. Initial encounter.  EXAM: CT ANGIOGRAPHY CHEST WITH CONTRAST  TECHNIQUE: Multidetector CT imaging of the chest was performed using the standard protocol during bolus administration of intravenous contrast. Multiplanar CT image reconstructions and MIPs were obtained to evaluate the vascular anatomy.  CONTRAST:  75 mL OMNIPAQUE IOHEXOL 350 MG/ML SOLN  COMPARISON:  Plain film of the chest earlier today.  FINDINGS: No pulmonary embolus is identified. Calcific aortic and coronary atherosclerosis is noted. Heart size is upper normal. No pleural or pericardial effusion. No axillary, hilar or mediastinal lymphadenopathy. The lungs demonstrate mild dependent atelectasis.  Incidentally imaged upper abdomen is unremarkable. Surgical neck fracture right humerus and diaphyseal fracture left clavicle are identified. No other fracture is seen.  Review of the MIP images confirms the above findings.  IMPRESSION: Negative for pulmonary embolus or acute cardiopulmonary disease.  Acute surgical neck fracture right humerus and mid diaphyseal fracture left clavicle.  Calcific aortic and coronary atherosclerosis.   Electronically Signed   By: Drusilla Kanner M.D.   On: 04/15/2015 20:54   Ct Cervical Spine Wo Contrast  04/15/2015   CLINICAL DATA:  The patient is from [facility] [place] with a confirmed L mid-clavicular fx per EMS. EMS states they did a portable x-ray of L shoulder after they were moving her today and she began to complain of shoulder pain. Pt is poor historian due to memory loss.  EXAM: CT HEAD WITHOUT CONTRAST  CT CERVICAL SPINE WITHOUT CONTRAST  TECHNIQUE: Multidetector CT imaging of the head and cervical spine was performed following the standard protocol without intravenous contrast. Multiplanar CT image reconstructions of the cervical spine were also generated.  COMPARISON:  11/03/2014  FINDINGS: CT HEAD FINDINGS   There is moderate central and cortical atrophy. Periventricular white matter changes are consistent with small vessel disease. There is no intra or extra-axial fluid collection or mass lesion. The basilar cisterns and ventricles have a normal appearance. There is no CT evidence for acute infarction or hemorrhage. Bone windows show no acute fractures. There is mild edema in the left frontal scalp region. No underlying fracture.  CT CERVICAL SPINE FINDINGS  There are significant degenerative changes throughout the cervical spine. There are no acute cervical fractures or traumatic subluxation. Lung apices are clear. The visualized portion of the thyroid gland has a normal appearance. Coarse ligamentum flavum calcifications are again noted at C4 and C5. There is mild narrowing of the thecal sac at these levels, stable in appearance.  There is a fracture of the left clavicle with inferior displacement of the distal fragment. Upper posterior ribs are intact.  IMPRESSION: 1. Atrophy and small vessel disease. 2.  No evidence for acute  abnormality. 3. Left frontal scalp edema without underlying fracture. 4. Significant degenerative changes in the cervical spine. 5. Left clavicle fracture.   Electronically Signed   By: Norva Pavlov M.D.   On: 04/15/2015 16:48   Ct Abdomen Pelvis W Contrast  04/15/2015   CLINICAL DATA:  Trauma.  Left clavicle fracture.  Initial encounter.  EXAM: CT ABDOMEN AND PELVIS WITH CONTRAST  TECHNIQUE: Multidetector CT imaging of the abdomen and pelvis was performed using the standard protocol following bolus administration of intravenous contrast.  CONTRAST:  100 mL OMNIPAQUE IOHEXOL 300 MG/ML  SOLN  COMPARISON:  None.  FINDINGS: Mild dependent atelectasis is seen lung bases. Heart size is normal. No pleural or pericardial effusion.  The gallbladder, spleen, adrenal glands and pancreas are unremarkable. Small parapelvic renal cysts are noted. A few small calcifications are identified in the  liver consistent with old granulomatous disease.  The patient is status post tubal ligation. The uterus and urinary bladder are unremarkable. The stomach, small and large bowel and appendix appear normal. No lymphadenopathy or fluid is identified.  No fracture is seen. No lytic or sclerotic bony lesion is noted. The patient is status post lower lumbar fusion. Multilevel degenerative disc disease is seen.  IMPRESSION: No acute finding abdomen or pelvis.  Incidental findings as above.   Electronically Signed   By: Drusilla Kanner M.D.   On: 04/15/2015 17:11   Dg Humerus Right  04/15/2015   CLINICAL DATA:  Left clavicle fracture diagnosed today. The patient is unable to provide history.  Initial encounter.  EXAM: RIGHT HUMERUS - 2+ VIEW  COMPARISON:  None.  FINDINGS: There is an acute surgical neck fracture of the right humerus. No other acute bony or joint abnormality is identified. Imaged right lung and ribs are unremarkable.  IMPRESSION: Acute surgical neck fracture right humerus.   Electronically Signed   By: Drusilla Kanner M.D.   On: 04/15/2015 17:16    EKG:   Orders placed or performed during the hospital encounter of 04/15/15  . ED EKG  . ED EKG  . ED EKG  . ED EKG  . EKG 12-Lead  . EKG 12-Lead    IMPRESSION AND PLAN:  Principal Problem:   Acute encephalopathy - likely due to acute stress from fractures with minimal baseline mental reserve in setting of dementia. Patient CT scan including CT head was negative. Active Problems:   Right humeral fracture - stabilized in the ED, patient is to follow-up with orthopedics in the outpatient setting. We'll ensure pain control.   Fracture of left clavicle - left arm in sling, treatment as above.   Tachycardia - likely due to pain from her fractures that she is unable to properly communicate.  Will get adequate pain control on board and monitor her vital signs for improvement. We will give her gentle hydration as well to see if that else.   HTN  (hypertension) - as above, likely due to pain, treated as above, continue home antihypertensives, when necessary IV antihypertensives as well for blood pressure goal less than 160/100   Dementia - home meds   Myasthenia gravis - chronic condition, generally stable, felt to be the cause of the weakness which may have led to her fall.   Type II diabetes mellitus - carb modified diet, sliding scale insulin with appropriate glucose checks   GERD (gastroesophageal reflux disease) - PPI while here  All the records are reviewed and case discussed with ED provider. Management plans discussed with the patient and/or family.  DVT PROPHYLAXIS: SubQ lovenox  ADMISSION STATUS: Observation  CODE STATUS: Full  TOTAL TIME TAKING CARE OF THIS PATIENT: 45 minutes.    Leonette Tischer FIELDING 04/15/2015, 10:49 PM  [facility]  Office  [phone]  CC: Primary care physician; Wynona Dove, MD

## 2015-04-15 NOTE — ED Notes (Signed)
Received bedside report from [place], [place].

## 2015-04-16 ENCOUNTER — Telehealth: Payer: Self-pay | Admitting: Internal Medicine

## 2015-04-16 ENCOUNTER — Telehealth: Payer: Self-pay

## 2015-04-16 LAB — BASIC METABOLIC PANEL
ANION GAP: 8 (ref 5–15)
BUN: 13 mg/dL (ref 6–20)
CALCIUM: 8.3 mg/dL — AB (ref 8.9–10.3)
CO2: 27 mmol/L (ref 22–32)
Chloride: 101 mmol/L (ref 101–111)
Creatinine, Ser: 0.6 mg/dL (ref 0.44–1.00)
GFR calc Af Amer: >60 mL/min (ref >60)
GFR calc non Af Amer: >60 mL/min (ref >60)
Glucose, Bld: 177 mg/dL — ABNORMAL HIGH (ref 65–99)
Potassium: 4.1 mmol/L (ref 3.5–5.1)
SODIUM: 136 mmol/L (ref 135–145)

## 2015-04-16 LAB — GLUCOSE, CAPILLARY
GLUCOSE-CAPILLARY: 176 mg/dL — AB (ref 65–99)
GLUCOSE-CAPILLARY: 203 mg/dL — AB (ref 65–99)

## 2015-04-16 LAB — CBC
HEMATOCRIT: 25.5 % — AB (ref 35.0–47.0)
Hemoglobin: 8.9 g/dL — ABNORMAL LOW (ref 12.0–16.0)
MCH: 29.6 pg (ref 26.0–34.0)
MCHC: 34.8 g/dL (ref 32.0–36.0)
MCV: 85.1 fL (ref 80.0–100.0)
PLATELETS: 269 10*3/uL (ref 150–440)
RBC: 3 MIL/uL — ABNORMAL LOW (ref 3.80–5.20)
RDW: 15.3 % — ABNORMAL HIGH (ref 11.5–14.5)
WBC: 7.9 10*3/uL (ref 3.6–11.0)

## 2015-04-16 LAB — HEMOGLOBIN A1C: Hgb A1c MFr Bld: 6.2 % — ABNORMAL HIGH (ref 4.0–6.0)

## 2015-04-16 MED ORDER — HYDROCODONE-ACETAMINOPHEN 5-325 MG PO TABS: 1.0000 | ORAL_TABLET | Freq: Four times a day (QID) | ORAL | Status: DC | PRN

## 2015-04-16 MED ORDER — METOPROLOL TARTRATE 25 MG PO TABS: 25.0000 mg | ORAL_TABLET | Freq: Two times a day (BID) | ORAL | Status: DC

## 2015-04-16 MED ORDER — METOPROLOL TARTRATE 25 MG PO TABS
25.0000 mg | ORAL_TABLET | Freq: Two times a day (BID) | ORAL | Status: DC
  Administered 2015-04-16: 25 mg via ORAL
  Filled 2015-04-16: qty 1

## 2015-04-16 NOTE — Discharge Summary (Addendum)
[facility] - [employer] at [facility]   PATIENT NAME: Robin Archer    MR#:  [id number]  DATE OF BIRTH:  01-27-1946  DATE OF ADMISSION:  04/15/2015 ADMITTING PHYSICIAN: Oralia Manis, MD  DATE OF DISCHARGE: 04/16/2015  PRIMARY CARE PHYSICIAN: Wynona Dove, MD    ADMISSION DIAGNOSIS:  Sinus tachycardia [I47.1] Clavicle fracture, left, closed, initial encounter [S42.002A] Humerus fracture, right, closed, initial encounter [S42.301A] Altered mental status, unspecified altered mental status type [R41.82]  DISCHARGE DIAGNOSIS:  Principal Problem:   Acute encephalopathy Active Problems:   Dementia   Right humeral fracture   Fracture of left clavicle   Tachycardia   HTN (hypertension)   Myasthenia gravis   Type II diabetes mellitus   GERD (gastroesophageal reflux disease)   SECONDARY DIAGNOSIS:   Past Medical History  Diagnosis Date  . Memory loss   . GERD (gastroesophageal reflux disease)   . Myasthenia gravis   . DM (diabetes mellitus)   . Hypertension   . Hyperlipidemia   . Spinal stenosis   . Vitamin B deficiency     HOSPITAL COURSE:  69 year old female with dementia and myasthenia gravis who presented from a nursing home after the nursing home claiming that the patient slipped from the handgrips and fell. She was found to have a right humeral fracture and a left clavicular fracture. For further details please further H&P.  1. Acute encephalopathy: This is due to acute stress from her fractures with underlying dementia. Patient appears to be back to baseline. Patient underwent a CT scan of the head which showed no acute etiology.  2. Right humeral fracture: Patient was stabilized in the emergency department. Patient will need to follow up with orthopedics as an outpatient. Patient will require pain medications.  3. Fracture of left clavicle: Patient is a left arm sitting. Treatment as stated above.  4. Sinus tachycardia: This is  secondary to her fractures and pain. Continue pain management. She underwent CT chest to evaluate for pulmonary emboli which was negative for pulmonary emboli. I added metoprolol to her regimen as her blood pressure was elevated as well as her tachycardia. This should control her heart rate better.   5. Essential hypertension: Patient's blood pressure was elevated due to pain. Patient will continue her home hypertensive medications and pain control. We also added metoprolol.  6. Dementia: Continue Aricept 7. Diabetes: Patient will continue her outpatient medication regimen and ADA diet.   DISCHARGE CONDITIONS AND DIET:  Regular/diabetic diet Patient's being discharged in stable condition  CONSULTS OBTAINED:  Treatment Team:  Juanell Fairly, MD  DRUG ALLERGIES:  No Known Allergies  DISCHARGE MEDICATIONS:   Current Discharge Medication List    START taking these medications   Details  HYDROcodone-acetaminophen (NORCO) 5-325 MG per tablet Take 1 tablet by mouth every 6 (six) hours as needed for moderate pain. Qty: 30 tablet, Refills: 0      CONTINUE these medications which have NOT CHANGED   Details  acetaminophen (TYLENOL) 325 MG tablet Take 650 mg by mouth 3 (three) times daily.    alendronate (FOSAMAX) 70 MG tablet Take 70 mg by mouth once a week. Pt takes on Tuesday.    aspirin 81 MG chewable tablet Chew 81 mg by mouth daily.    cholecalciferol (VITAMIN D) 1000 UNITS tablet Take 1,000 Units by mouth daily.    citalopram (CELEXA) 20 MG tablet Take 20 mg by mouth daily.    donepezil (ARICEPT) 5 MG tablet Take 5 mg by mouth  at bedtime.    ferrous sulfate 325 (65 FE) MG tablet Take 325 mg by mouth 2 (two) times daily.    gabapentin (NEURONTIN) 300 MG capsule Take 300 mg by mouth at bedtime.    insulin glargine (LANTUS) 100 UNIT/ML injection Inject 6 Units into the skin at bedtime.    insulin lispro (HUMALOG) 100 UNIT/ML injection Inject 1-4 Units into the skin 2 (two)  times daily. Pt uses four units in the morning before breakfast, two units before lunch, and one unit before dinner.    lisinopril (PRINIVIL,ZESTRIL) 10 MG tablet Take 10 mg by mouth daily.    magnesium oxide (MAG-OX) 400 MG tablet Take 800 mg by mouth 2 (two) times daily.    metFORMIN (GLUCOPHAGE) 1000 MG tablet Take 1,000 mg by mouth 2 (two) times daily.    omeprazole (PRILOSEC) 20 MG capsule Take 20 mg by mouth daily.    vitamin B-12 (CYANOCOBALAMIN) 1000 MCG tablet Take 1,000 mcg by mouth every other day.    vitamin C (ASCORBIC ACID) 500 MG tablet Take 500 mg by mouth daily.       Metoprolol 25 mg by mouth twice a day       Today   CHIEF COMPLAINT:  Patient has dementia and only follows very simple commands. She is at her baseline.   VITAL SIGNS:  Blood pressure 162/69, pulse 119, temperature 98.6 F (37 C), temperature source Oral, resp. rate 16, height 5\' 4"  (1.626 m), weight 59.013 kg (130 lb 1.6 oz), SpO2 98 %.   REVIEW OF SYSTEMS:  Review of Systems  Unable to perform ROS: dementia     PHYSICAL EXAMINATION:  GENERAL:  69 y.o.-year-old patient lying in the bed with no acute distress.  NECK:  Supple, no jugular venous distention. No thyroid enlargement, no tenderness.  LUNGS: Normal breath sounds bilaterally, no wheezing, rales,rhonchi  No use of accessory muscles of respiration.  CARDIOVASCULAR: Tachycardia. No murmurs, rubs, or gallops.  ABDOMEN: Soft, non-tender, non-distended. Bowel sounds present. No organomegaly or mass.  EXTREMITIES: No pedal edema, cyanosis, or clubbing. She is in a sling PSYCHIATRIC: The patient is alert she has dementia, severe  SKIN: No obvious rash, lesion, or ulcer.   DATA REVIEW:   CBC  Recent Labs Lab 04/16/15 0516  WBC 7.9  HGB 8.9*  HCT 25.5*  PLT 269    Chemistries   Recent Labs Lab 04/15/15 1549 04/16/15 0516  NA 134* 136  K 4.8 4.1  CL 96* 101  CO2 24 27  GLUCOSE 235* 177*  BUN 17 13  CREATININE  0.73 0.60  CALCIUM 9.0 8.3*  AST 31  --   ALT 14  --   ALKPHOS 49  --   BILITOT 0.2*  --     Cardiac Enzymes  Recent Labs Lab 04/15/15 1549  TROPONINI <0.03    Microbiology Results  @[email or web address] @  RADIOLOGY:  Dg Chest 1 View  04/15/2015   CLINICAL DATA:  Possible fall.  Left clavicle fracture.  EXAM: CHEST  1 VIEW  COMPARISON:  01/06/2013  FINDINGS: The cardiomediastinal silhouette is unchanged and within normal limits. The lungs are mildly hypoinflated with minimal bibasilar atelectasis. No pleural effusion or pneumothorax is identified. There is a mildly-to-moderately displaced midshaft fracture of the left clavicle. There is also a mildly displaced right humeral neck fracture.  IMPRESSION: 1. Midshaft left clavicle fracture. 2. Mildly displaced proximal right humerus fracture, incompletely evaluated. 3. Hypoinflation with minimal bibasilar atelectasis.   Electronically Signed  By: Sebastian Ache   On: 04/15/2015 16:09   Dg Pelvis 1-2 Views  04/15/2015   CLINICAL DATA:  Possible fall. Poor historian. Possible clavicle fracture.  EXAM: PELVIS - 1-2 VIEW  COMPARISON:  None.  FINDINGS: There is no evidence of pelvic fracture or diastasis. No pelvic bone lesions are seen.  Previous lumbar fusion. Bilateral tubal ligation clips are present. Bones appear osteopenic. Regional bowel gas pattern is nonobstructive.  IMPRESSION: No evidence for acute  abnormality.   Electronically Signed   By: Norva Pavlov M.D.   On: 04/15/2015 16:07   Ct Head Wo Contrast  04/15/2015   CLINICAL DATA:  The patient is from [facility] at [place] with a confirmed L mid-clavicular fx per EMS. EMS states they did a portable x-ray of L shoulder after they were moving her today and she began to complain of shoulder pain. Pt is poor historian due to memory loss.  EXAM: CT HEAD WITHOUT CONTRAST  CT CERVICAL SPINE WITHOUT CONTRAST  TECHNIQUE: Multidetector CT imaging of the head and cervical spine was performed  following the standard protocol without intravenous contrast. Multiplanar CT image reconstructions of the cervical spine were also generated.  COMPARISON:  11/03/2014  FINDINGS: CT HEAD FINDINGS  There is moderate central and cortical atrophy. Periventricular white matter changes are consistent with small vessel disease. There is no intra or extra-axial fluid collection or mass lesion. The basilar cisterns and ventricles have a normal appearance. There is no CT evidence for acute infarction or hemorrhage. Bone windows show no acute fractures. There is mild edema in the left frontal scalp region. No underlying fracture.  CT CERVICAL SPINE FINDINGS  There are significant degenerative changes throughout the cervical spine. There are no acute cervical fractures or traumatic subluxation. Lung apices are clear. The visualized portion of the thyroid gland has a normal appearance. Coarse ligamentum flavum calcifications are again noted at C4 and C5. There is mild narrowing of the thecal sac at these levels, stable in appearance.  There is a fracture of the left clavicle with inferior displacement of the distal fragment. Upper posterior ribs are intact.  IMPRESSION: 1. Atrophy and small vessel disease. 2.  No evidence for acute  abnormality. 3. Left frontal scalp edema without underlying fracture. 4. Significant degenerative changes in the cervical spine. 5. Left clavicle fracture.   Electronically Signed   By: Norva Pavlov M.D.   On: 04/15/2015 16:48   Ct Angio Chest Pe W/cm &/or Wo Cm  04/15/2015   CLINICAL DATA:  Tachycardia and elevated D-dimer today. Acute right humerus and left clavicle fractures. Initial encounter.  EXAM: CT ANGIOGRAPHY CHEST WITH CONTRAST  TECHNIQUE: Multidetector CT imaging of the chest was performed using the standard protocol during bolus administration of intravenous contrast. Multiplanar CT image reconstructions and MIPs were obtained to evaluate the vascular anatomy.  CONTRAST:  75 mL  OMNIPAQUE IOHEXOL 350 MG/ML SOLN  COMPARISON:  Plain film of the chest earlier today.  FINDINGS: No pulmonary embolus is identified. Calcific aortic and coronary atherosclerosis is noted. Heart size is upper normal. No pleural or pericardial effusion. No axillary, hilar or mediastinal lymphadenopathy. The lungs demonstrate mild dependent atelectasis.  Incidentally imaged upper abdomen is unremarkable. Surgical neck fracture right humerus and diaphyseal fracture left clavicle are identified. No other fracture is seen.  Review of the MIP images confirms the above findings.  IMPRESSION: Negative for pulmonary embolus or acute cardiopulmonary disease.  Acute surgical neck fracture right humerus and mid diaphyseal fracture  left clavicle.  Calcific aortic and coronary atherosclerosis.   Electronically Signed   By: Drusilla Kanner M.D.   On: 04/15/2015 20:54   Ct Cervical Spine Wo Contrast  04/15/2015   CLINICAL DATA:  The patient is from [facility] [place] with a confirmed L mid-clavicular fx per EMS. EMS states they did a portable x-ray of L shoulder after they were moving her today and she began to complain of shoulder pain. Pt is poor historian due to memory loss.  EXAM: CT HEAD WITHOUT CONTRAST  CT CERVICAL SPINE WITHOUT CONTRAST  TECHNIQUE: Multidetector CT imaging of the head and cervical spine was performed following the standard protocol without intravenous contrast. Multiplanar CT image reconstructions of the cervical spine were also generated.  COMPARISON:  11/03/2014  FINDINGS: CT HEAD FINDINGS  There is moderate central and cortical atrophy. Periventricular white matter changes are consistent with small vessel disease. There is no intra or extra-axial fluid collection or mass lesion. The basilar cisterns and ventricles have a normal appearance. There is no CT evidence for acute infarction or hemorrhage. Bone windows show no acute fractures. There is mild edema in the left frontal scalp region.  No underlying fracture.  CT CERVICAL SPINE FINDINGS  There are significant degenerative changes throughout the cervical spine. There are no acute cervical fractures or traumatic subluxation. Lung apices are clear. The visualized portion of the thyroid gland has a normal appearance. Coarse ligamentum flavum calcifications are again noted at C4 and C5. There is mild narrowing of the thecal sac at these levels, stable in appearance.  There is a fracture of the left clavicle with inferior displacement of the distal fragment. Upper posterior ribs are intact.  IMPRESSION: 1. Atrophy and small vessel disease. 2.  No evidence for acute  abnormality. 3. Left frontal scalp edema without underlying fracture. 4. Significant degenerative changes in the cervical spine. 5. Left clavicle fracture.   Electronically Signed   By: Norva Pavlov M.D.   On: 04/15/2015 16:48   Ct Abdomen Pelvis W Contrast  04/15/2015 IMPRESSION: No acute finding abdomen or pelvis.  Incidental findings as above.   Electronically Signed   By: Drusilla Kanner M.D.   On: 04/15/2015 17:11   Dg Humerus Right  04/15/2015     IMPRESSION: Acute surgical neck fracture right humerus.   Electronically Signed   By: Drusilla Kanner M.D.   On: 04/15/2015 17:16      Stable for discharge  Patient should follow up with orthopedics in one week  CODE STATUS:     Code Status Orders        Start     Ordered   04/15/15 2328  Full code   Continuous     04/15/15 2327      TOTAL TIME TAKING CARE OF THIS PATIENT: 35 minutes.    Patriciann Becht M.D on 04/16/2015 at 10:38 AM  Between 7am to 6pm - Pager - [phone] After 6pm go to [email or web address] - password [email or web address] [facility]  [facility]  Office  [phone]  CC: Primary care physician; Wynona Dove, MD

## 2015-04-16 NOTE — Progress Notes (Signed)
Patient is medically stable for D/C to [facility] today. Per [facility] admissions coordinator at [facility] patient is going to room A-7. RN will call report and arrange EMS for transport. Clinical Child psychotherapist (CSW) prepared D/C packet and sent D/C Summary and follow up appointment to [facility] via [facility]. Patient's daughter [name] is at bedside and aware of above. Please reconsult if future social work needs arise. CSW signing off.   Jetta Lout, LCSWA [phone]

## 2015-04-16 NOTE — Progress Notes (Signed)
Discharge Note:  Report called to Crystal, at [facility] also notified of her of pts upcoming ortho appt. .Sling and immobilizer remain in place.  VSS. EMS transporting pt.

## 2015-04-16 NOTE — Plan of Care (Signed)
Problem: Phase I Progression Outcomes Goal: Pain controlled with appropriate interventions Outcome: Progressing Iv morphine for pain controle Goal: OOB as tolerated unless otherwise ordered Outcome: Progressing Pt does not ambulate Goal: Voiding-avoid urinary catheter unless indicated Outcome: Completed/Met Date Met:  04/16/15 Pt does not have foley catheter.

## 2015-04-16 NOTE — Progress Notes (Signed)
Spoke with Baldo Ash, [facility] rep at [phone], to notify of non-emergent EMS transport.  Auth notification reference given as [id number].   Service date range good from 04/16/15 - 07/15/15.   Gap exception requested to determine if services can be considered at an in-network level.

## 2015-04-16 NOTE — Telephone Encounter (Signed)
Noted. Thank you. Will follow up.

## 2015-04-16 NOTE — Progress Notes (Signed)
Pt alert but has some periods of confusion. Able to answer simple questions. Medicated for arm pain. Remaining on telemetry. Tolerating sling and arm brace. incontinent of bladder and bowel.

## 2015-04-16 NOTE — Consult Note (Addendum)
ORTHOPAEDIC CONSULTATION  REQUESTING PHYSICIAN: Adrian Saran, MD  Chief Complaint: Bilateral shoulder pain status post fall  HPI: Robin Archer is a 69 y.o. female who complains of  pain in both shoulders. She is a resident of all fields in the abdomen. Patient sustained a fall while being transferred with a Hoyer lift. This occurred yesterday. Patient has dementia and is unable to provide an accurate history. Historical information is obtained from her daughter. The daughter explains the patient has dementia. She had difficulty raising her right arm even prior to her fall. Patient does not ambulate at baseline and uses a wheelchair according to the daughter.  Past Medical History  Diagnosis Date  . Memory loss   . GERD (gastroesophageal reflux disease)   . Myasthenia gravis   . DM (diabetes mellitus)   . Hypertension   . Hyperlipidemia   . Spinal stenosis   . Vitamin B deficiency    Past Surgical History  Procedure Laterality Date  . Cataract extraction     History   Social History  . Marital Status: Widowed    Spouse Name: N/A  . Number of Children: N/A  . Years of Education: N/A   Social History Main Topics  . Smoking status: Never Smoker   . Smokeless tobacco: Not on file  . Alcohol Use: No  . Drug Use: No  . Sexual Activity: Not on file   Other Topics Concern  . None   Social History Narrative  . None   History reviewed. No pertinent family history. No Known Allergies Prior to Admission medications   Medication Sig Start Date End Date Taking? Authorizing Provider  acetaminophen (TYLENOL) 325 MG tablet Take 650 mg by mouth 3 (three) times daily.   Yes Historical Provider, MD  alendronate (FOSAMAX) 70 MG tablet Take 70 mg by mouth once a week. Pt takes on Tuesday.   Yes Historical Provider, MD  aspirin 81 MG chewable tablet Chew 81 mg by mouth daily.   Yes Historical Provider, MD  cholecalciferol (VITAMIN D) 1000 UNITS tablet Take 1,000 Units by mouth daily.    Yes Historical Provider, MD  citalopram (CELEXA) 20 MG tablet Take 20 mg by mouth daily.   Yes Historical Provider, MD  donepezil (ARICEPT) 5 MG tablet Take 5 mg by mouth at bedtime.   Yes Historical Provider, MD  ferrous sulfate 325 (65 FE) MG tablet Take 325 mg by mouth 2 (two) times daily.   Yes Historical Provider, MD  gabapentin (NEURONTIN) 300 MG capsule Take 300 mg by mouth at bedtime.   Yes Historical Provider, MD  insulin glargine (LANTUS) 100 UNIT/ML injection Inject 6 Units into the skin at bedtime.   Yes Historical Provider, MD  insulin lispro (HUMALOG) 100 UNIT/ML injection Inject 1-4 Units into the skin 2 (two) times daily. Pt uses four units in the morning before breakfast, two units before lunch, and one unit before dinner.   Yes Historical Provider, MD  lisinopril (PRINIVIL,ZESTRIL) 10 MG tablet Take 10 mg by mouth daily.   Yes Historical Provider, MD  magnesium oxide (MAG-OX) 400 MG tablet Take 800 mg by mouth 2 (two) times daily.   Yes Historical Provider, MD  metFORMIN (GLUCOPHAGE) 1000 MG tablet Take 1,000 mg by mouth 2 (two) times daily.   Yes Historical Provider, MD  omeprazole (PRILOSEC) 20 MG capsule Take 20 mg by mouth daily.   Yes Historical Provider, MD  vitamin B-12 (CYANOCOBALAMIN) 1000 MCG tablet Take 1,000 mcg by mouth every other  day.   Yes Historical Provider, MD  vitamin C (ASCORBIC ACID) 500 MG tablet Take 500 mg by mouth daily.   Yes Historical Provider, MD  HYDROcodone-acetaminophen (NORCO) 5-325 MG per tablet Take 1 tablet by mouth every 6 (six) hours as needed for moderate pain. 04/16/15   Adrian Saran, MD  metoprolol tartrate (LOPRESSOR) 25 MG tablet Take 1 tablet (25 mg total) by mouth 2 (two) times daily. 04/16/15   Adrian Saran, MD   Dg Chest 1 View  04/15/2015   CLINICAL DATA:  Possible fall.  Left clavicle fracture.  EXAM: CHEST  1 VIEW  COMPARISON:  01/06/2013  FINDINGS: The cardiomediastinal silhouette is unchanged and within normal limits. The lungs are mildly  hypoinflated with minimal bibasilar atelectasis. No pleural effusion or pneumothorax is identified. There is a mildly-to-moderately displaced midshaft fracture of the left clavicle. There is also a mildly displaced right humeral neck fracture.  IMPRESSION: 1. Midshaft left clavicle fracture. 2. Mildly displaced proximal right humerus fracture, incompletely evaluated. 3. Hypoinflation with minimal bibasilar atelectasis.   Electronically Signed   By: Sebastian Ache   On: 04/15/2015 16:09   Dg Pelvis 1-2 Views  04/15/2015   CLINICAL DATA:  Possible fall. Poor historian. Possible clavicle fracture.  EXAM: PELVIS - 1-2 VIEW  COMPARISON:  None.  FINDINGS: There is no evidence of pelvic fracture or diastasis. No pelvic bone lesions are seen.  Previous lumbar fusion. Bilateral tubal ligation clips are present. Bones appear osteopenic. Regional bowel gas pattern is nonobstructive.  IMPRESSION: No evidence for acute  abnormality.   Electronically Signed   By: Norva Pavlov M.D.   On: 04/15/2015 16:07   Ct Head Wo Contrast  04/15/2015   CLINICAL DATA:  The patient is from [facility] [place] with a confirmed L mid-clavicular fx per EMS. EMS states they did a portable x-ray of L shoulder after they were moving her today and she began to complain of shoulder pain. Pt is poor historian due to memory loss.  EXAM: CT HEAD WITHOUT CONTRAST  CT CERVICAL SPINE WITHOUT CONTRAST  TECHNIQUE: Multidetector CT imaging of the head and cervical spine was performed following the standard protocol without intravenous contrast. Multiplanar CT image reconstructions of the cervical spine were also generated.  COMPARISON:  11/03/2014  FINDINGS: CT HEAD FINDINGS  There is moderate central and cortical atrophy. Periventricular white matter changes are consistent with small vessel disease. There is no intra or extra-axial fluid collection or mass lesion. The basilar cisterns and ventricles have a normal appearance. There is no CT  evidence for acute infarction or hemorrhage. Bone windows show no acute fractures. There is mild edema in the left frontal scalp region. No underlying fracture.  CT CERVICAL SPINE FINDINGS  There are significant degenerative changes throughout the cervical spine. There are no acute cervical fractures or traumatic subluxation. Lung apices are clear. The visualized portion of the thyroid gland has a normal appearance. Coarse ligamentum flavum calcifications are again noted at C4 and C5. There is mild narrowing of the thecal sac at these levels, stable in appearance.  There is a fracture of the left clavicle with inferior displacement of the distal fragment. Upper posterior ribs are intact.  IMPRESSION: 1. Atrophy and small vessel disease. 2.  No evidence for acute  abnormality. 3. Left frontal scalp edema without underlying fracture. 4. Significant degenerative changes in the cervical spine. 5. Left clavicle fracture.   Electronically Signed   By: Norva Pavlov M.D.   On:  04/15/2015 16:48   Ct Angio Chest Pe W/cm &/or Wo Cm  04/15/2015   CLINICAL DATA:  Tachycardia and elevated D-dimer today. Acute right humerus and left clavicle fractures. Initial encounter.  EXAM: CT ANGIOGRAPHY CHEST WITH CONTRAST  TECHNIQUE: Multidetector CT imaging of the chest was performed using the standard protocol during bolus administration of intravenous contrast. Multiplanar CT image reconstructions and MIPs were obtained to evaluate the vascular anatomy.  CONTRAST:  75 mL OMNIPAQUE IOHEXOL 350 MG/ML SOLN  COMPARISON:  Plain film of the chest earlier today.  FINDINGS: No pulmonary embolus is identified. Calcific aortic and coronary atherosclerosis is noted. Heart size is upper normal. No pleural or pericardial effusion. No axillary, hilar or mediastinal lymphadenopathy. The lungs demonstrate mild dependent atelectasis.  Incidentally imaged upper abdomen is unremarkable. Surgical neck fracture right humerus and diaphyseal fracture  left clavicle are identified. No other fracture is seen.  Review of the MIP images confirms the above findings.  IMPRESSION: Negative for pulmonary embolus or acute cardiopulmonary disease.  Acute surgical neck fracture right humerus and mid diaphyseal fracture left clavicle.  Calcific aortic and coronary atherosclerosis.   Electronically Signed   By: Drusilla Kanner M.D.   On: 04/15/2015 20:54   Ct Cervical Spine Wo Contrast  04/15/2015   CLINICAL DATA:  The patient is from [facility] [place] with a confirmed L mid-clavicular fx per EMS. EMS states they did a portable x-ray of L shoulder after they were moving her today and she began to complain of shoulder pain. Pt is poor historian due to memory loss.  EXAM: CT HEAD WITHOUT CONTRAST  CT CERVICAL SPINE WITHOUT CONTRAST  TECHNIQUE: Multidetector CT imaging of the head and cervical spine was performed following the standard protocol without intravenous contrast. Multiplanar CT image reconstructions of the cervical spine were also generated.  COMPARISON:  11/03/2014  FINDINGS: CT HEAD FINDINGS  There is moderate central and cortical atrophy. Periventricular white matter changes are consistent with small vessel disease. There is no intra or extra-axial fluid collection or mass lesion. The basilar cisterns and ventricles have a normal appearance. There is no CT evidence for acute infarction or hemorrhage. Bone windows show no acute fractures. There is mild edema in the left frontal scalp region. No underlying fracture.  CT CERVICAL SPINE FINDINGS  There are significant degenerative changes throughout the cervical spine. There are no acute cervical fractures or traumatic subluxation. Lung apices are clear. The visualized portion of the thyroid gland has a normal appearance. Coarse ligamentum flavum calcifications are again noted at C4 and C5. There is mild narrowing of the thecal sac at these levels, stable in appearance.  There is a fracture of the left  clavicle with inferior displacement of the distal fragment. Upper posterior ribs are intact.  IMPRESSION: 1. Atrophy and small vessel disease. 2.  No evidence for acute  abnormality. 3. Left frontal scalp edema without underlying fracture. 4. Significant degenerative changes in the cervical spine. 5. Left clavicle fracture.   Electronically Signed   By: Norva Pavlov M.D.   On: 04/15/2015 16:48   Ct Abdomen Pelvis W Contrast  04/15/2015   CLINICAL DATA:  Trauma.  Left clavicle fracture.  Initial encounter.  EXAM: CT ABDOMEN AND PELVIS WITH CONTRAST  TECHNIQUE: Multidetector CT imaging of the abdomen and pelvis was performed using the standard protocol following bolus administration of intravenous contrast.  CONTRAST:  100 mL OMNIPAQUE IOHEXOL 300 MG/ML  SOLN  COMPARISON:  None.  FINDINGS: Mild dependent atelectasis  is seen lung bases. Heart size is normal. No pleural or pericardial effusion.  The gallbladder, spleen, adrenal glands and pancreas are unremarkable. Small parapelvic renal cysts are noted. A few small calcifications are identified in the liver consistent with old granulomatous disease.  The patient is status post tubal ligation. The uterus and urinary bladder are unremarkable. The stomach, small and large bowel and appendix appear normal. No lymphadenopathy or fluid is identified.  No fracture is seen. No lytic or sclerotic bony lesion is noted. The patient is status post lower lumbar fusion. Multilevel degenerative disc disease is seen.  IMPRESSION: No acute finding abdomen or pelvis.  Incidental findings as above.   Electronically Signed   By: Drusilla Kanner M.D.   On: 04/15/2015 17:11   Dg Humerus Right  04/15/2015   CLINICAL DATA:  Left clavicle fracture diagnosed today. The patient is unable to provide history. Initial encounter.  EXAM: RIGHT HUMERUS - 2+ VIEW  COMPARISON:  None.  FINDINGS: There is an acute surgical neck fracture of the right humerus. No other acute bony or joint  abnormality is identified. Imaged right lung and ribs are unremarkable.  IMPRESSION: Acute surgical neck fracture right humerus.   Electronically Signed   By: Drusilla Kanner M.D.   On: 04/15/2015 17:16    Positive ROS: All other systems have been reviewed and were otherwise negative with the exception of those mentioned in the HPI and as above.  Physical Exam:  MUSCULOSKELETAL: Patient is currently seen in her bed. She is in no acute distress and appears comfortable. Patient is drowsy but arousable.  Right shoulder: Patient is wearing a right shoulder immobilizer. Patient's skin is intact. She has tenderness over the right proximal humerus. There is no erythema or ecchymosis or swelling seen. There is no obvious deformity. The patient has intact sensation throughout the right upper extremity. She will not move her fingers but it is difficult to determine if this is due to pain, confusion or neurologic deficit. The daughter is unsure whether the patient can move her fingers at baseline. Her hand is well-perfused and she has a palpable radial pulse. Patient states that she has intact sensation to light touch in all 5 digits of the right hand.  Left shoulder: Patient is wearing a shoulder sling. Patient has tenderness over the midshaft of the clavicle. The skin is intact. There is no erythema or ecchymosis or swelling seen. There is no obvious deformity seen. Patient can flex and extend her fingers on the left hand, her digits are well perfused and she has a palpable radial pulse.  Assessment: #1 right shoulder proximal humerus fracture with displacement #2 left midshaft clavicle fracture with displacement  Plan: I splinted the patient's daughter about the patient's injuries. She understands there is a displaced proximal humerus fracture. There is medial translation of the humeral shaft in relation to the humeral head. The fracture is transverse through the surgical neck. There is mild comminution.  The patient also has a displaced fracture involving the left clavicle. His proximal centimeter diastases at the fracture site. Based on the patient's age, baseline functional status and dementia, I am recommending these be treated nonoperatively. The daughter agreed with this plan. She will continue use of a left shoulder sling and right shoulder immobilizer for her injuries. She should be nonweightbearing on both upper extremities. She will likely need to be transfer only from bed to chair. She may not use a walker or crutches. I would like to see the  patient back in follow-up in 1-2 weeks. She will have repeat x-rays in my office to evaluate the fractures for further displacement. Surgery could be considered if there is significant displacement. Continue with current pain management with Norco. The patient should begin transitioning to Tylenol for pain control when appropriate. The daughter has noted the patient is more drowsy and less responsive on narcotics. Patient may be discharged back to hospital fields from an orthopedic standpoint when cleared by medicine.  I have explained to the patient's daughter that a significant injury such as the one her mother has sustained, may lead to overall general health decline and possible death. Bilateral upper extremity fractures can be a significant stress on the body of an elderly patient with dementia. She understood this was a possibility. The daughter understood and agreed with the above-noted plan.    Juanell Fairly, MD    04/16/2015 2:41 PM

## 2015-04-16 NOTE — Clinical Social Work Note (Signed)
Clinical Social Work Assessment  Patient Details  Name: Robin Archer MRN: [id number] Date of Birth: 1945-12-06  Date of referral:  04/16/15               Reason for consult:  Facility Placement, Other (Comment Required) (From [facility] )                Permission sought to share information with:  Chartered certified accountant granted to share information::  Yes, Verbal Permission Granted  Name::      Production manager::   [place]   Relationship::     Contact Information:     Housing/Transportation Living arrangements for the past 2 months:  Bent of Information:  Patient, Adult Children Patient Interpreter Needed:  None Criminal Activity/Legal Involvement Pertinent to Current Situation/Hospitalization:  No - Comment as needed Significant Relationships:  Adult Children Lives with:  Facility Resident Do you feel safe going back to the place where you live?  Yes Need for family participation in patient care:  Yes (Comment)  Care giving concerns:  Patient is a long term care resident at [facility].    Social Worker assessment / plan:  Holiday representative (CSW) received consult that patient is from a facility and met with patient to address consult. Patient was laying in the bed and did not respond verbally. Patient moved her head up and down when she was asked if she knew where she was at. Per RN patient is from [place]. CSW asked patient if she wanted to return back to [facility] and she moved her head up and down. CSW contacted patient's daughter Robin Archer. Per daughter patient has been at [facility] since April 2016. Daughter is agreeable for patient to return to [place]. CSW contacted [facility] admissions coordinator at [place] and made him aware of above. Per Liliane Channel patient is a long term care resident and can return when stable.    Employment status:  Retired Nurse, adult PT Recommendations:  Not  assessed at this time Information / Referral to community resources:  [place]  Patient/Family's Response to care:  Patient's daughter is agreeable for her to return to [employer].   Patient/Family's Understanding of and Emotional Response to Diagnosis, Current Treatment, and Prognosis Patient's daughter Robin Archer thanked CSW for calling and assistance with getting back to [place].   Emotional Assessment Appearance:  Appears stated age Attitude/Demeanor/Rapport:    Affect (typically observed):  Flat Orientation:  Fluctuating Orientation (Suspected and/or reported Sundowners) Alcohol / Substance use:  Not Applicable Psych involvement (Current and /or in the community):  No (Comment)  Discharge Needs  Concerns to be addressed:  Discharge Planning Concerns Readmission within the last 30 days:  No Current discharge risk:  Chronically ill Barriers to Discharge:  Continued Medical Work up   Loralyn Freshwater, LCSW 04/16/2015, 2:27 PM

## 2015-04-16 NOTE — Discharge Instructions (Addendum)
Orthopedic discharge instructions:  Patient has a fracture of the right proximal humerus which needs to be treated in a shoulder immobilizer. Patient is nonweightbearing on the right upper extremity. She should wear the shoulder immobilizer at all times except for hygiene. Patient should not be lifted by or under her right upper extremity. Patient should not attempt to lift her right arm.  Weightbearing on or pulling on the right upper extremity may displace her fracture.  Patient also has a fracture of the left clavicle. She is also nonweightbearing on the left upper extremity. She should wear a sling for her left upper extremity. She may remove the sling to attempt activity such as feeding herself. She may perform activities involving the left fingers wrist and elbow as her pain allows. Patient should not be lifted with her left upper extremity either.  Patient will need assistance with activities of daily living such as washing, hygiene and feeding.  She should continue wearing her shoulder immobilizer and sling for 4 weeks.  Patient is currently taking Norco for pain control. I recommend the patient be transitioned off of narcotics and onto a regular schedule of Tylenol when appropriate. With the patient's history of dementia narcotics may lead to increased confusion and possible delirium.  Patient will follow up in my office in 1-2 weeks for reevaluation and x-ray.  The current plan is for the patient to be treated nonoperatively for both her fractures.

## 2015-04-16 NOTE — Telephone Encounter (Signed)
Unable to reach patient.  First TCM attempt. Message to follow up with daughter.  Left message to call back.

## 2015-04-16 NOTE — Progress Notes (Signed)
rn called dr mody. [facility] called . Heart rate as high as 140s but down to 128. Vs 160/82. Pulse  124 . Called to dr mody . md reports she will start lopressor

## 2015-04-16 NOTE — Telephone Encounter (Signed)
Pt needs a follow up appt from hospital pt discharged on 08/05. Dx: Acute Encephalopathy. Contact number is daughter. rm

## 2015-04-19 ENCOUNTER — Telehealth: Payer: Self-pay

## 2015-04-19 NOTE — Telephone Encounter (Signed)
Unable to reach patient. TCM call 2nd attempt. Will continue to contact patient for f/u appointment.

## 2015-04-21 ENCOUNTER — Telehealth: Payer: Self-pay

## 2015-04-21 NOTE — Telephone Encounter (Signed)
Attempted to make HFU appointment for patient.  Spoke with daughter Rosey Bath) and she said patient is living at [facility] [place], bedridden and cannot stand or be moved.  Daughters ([name] or Rosey Bath) to call as point of contact and will update with more information as appropriate.

## 2015-04-22 NOTE — Telephone Encounter (Signed)
Thanks. I have actually never seen this patient. She can likely be followed by the physician at [facility].

## 2015-05-26 DIAGNOSIS — F0280 Dementia in other diseases classified elsewhere without behavioral disturbance: Secondary | ICD-10-CM | POA: Insufficient documentation

## 2015-05-26 DIAGNOSIS — F0150 Vascular dementia without behavioral disturbance: Secondary | ICD-10-CM | POA: Insufficient documentation

## 2016-11-03 ENCOUNTER — Other Ambulatory Visit: Payer: Self-pay | Admitting: Neurology

## 2016-11-03 DIAGNOSIS — F0390 Unspecified dementia without behavioral disturbance: Secondary | ICD-10-CM

## 2016-11-03 DIAGNOSIS — F03C0 Unspecified dementia, severe, without behavioral disturbance, psychotic disturbance, mood disturbance, and anxiety: Secondary | ICD-10-CM

## 2016-11-10 ENCOUNTER — Ambulatory Visit
Admission: RE | Admit: 2016-11-10 | Discharge: 2016-11-10 | Disposition: A | Discharge status: Home / Self Care | Payer: Medicare Other | Source: Ambulatory Visit | Attending: Neurology | Admitting: Neurology

## 2016-11-10 ENCOUNTER — Ambulatory Visit: Admission: RE | Admit: 2016-11-10 | Payer: Medicare Other | Source: Ambulatory Visit

## 2016-11-10 DIAGNOSIS — F03C0 Unspecified dementia, severe, without behavioral disturbance, psychotic disturbance, mood disturbance, and anxiety: Secondary | ICD-10-CM

## 2016-11-10 DIAGNOSIS — F0390 Unspecified dementia without behavioral disturbance: Secondary | ICD-10-CM | POA: Insufficient documentation

## 2016-11-10 DIAGNOSIS — G319 Degenerative disease of nervous system, unspecified: Secondary | ICD-10-CM | POA: Diagnosis not present

## 2016-11-10 DIAGNOSIS — I6782 Cerebral ischemia: Secondary | ICD-10-CM | POA: Diagnosis not present

## 2016-11-10 DIAGNOSIS — M6289 Other specified disorders of muscle: Secondary | ICD-10-CM | POA: Diagnosis present

## 2018-05-26 ENCOUNTER — Emergency Department
Admission: EM | Admit: 2018-05-26 | Discharge: 2018-05-26 | Disposition: A | Discharge status: Home / Self Care | Payer: Medicare Other | Attending: Emergency Medicine | Admitting: Emergency Medicine

## 2018-05-26 ENCOUNTER — Emergency Department: Payer: Medicare Other

## 2018-05-26 ENCOUNTER — Encounter: Payer: Self-pay | Admitting: Emergency Medicine

## 2018-05-26 DIAGNOSIS — S0121XA Laceration without foreign body of nose, initial encounter: Secondary | ICD-10-CM | POA: Diagnosis not present

## 2018-05-26 DIAGNOSIS — Z23 Encounter for immunization: Secondary | ICD-10-CM | POA: Insufficient documentation

## 2018-05-26 DIAGNOSIS — S0181XA Laceration without foreign body of other part of head, initial encounter: Secondary | ICD-10-CM

## 2018-05-26 DIAGNOSIS — S0993XA Unspecified injury of face, initial encounter: Secondary | ICD-10-CM | POA: Diagnosis present

## 2018-05-26 DIAGNOSIS — W19XXXA Unspecified fall, initial encounter: Secondary | ICD-10-CM

## 2018-05-26 DIAGNOSIS — Y998 Other external cause status: Secondary | ICD-10-CM | POA: Insufficient documentation

## 2018-05-26 DIAGNOSIS — W06XXXA Fall from bed, initial encounter: Secondary | ICD-10-CM | POA: Insufficient documentation

## 2018-05-26 DIAGNOSIS — Y9389 Activity, other specified: Secondary | ICD-10-CM | POA: Diagnosis not present

## 2018-05-26 DIAGNOSIS — Y92122 Bedroom in nursing home as the place of occurrence of the external cause: Secondary | ICD-10-CM | POA: Diagnosis not present

## 2018-05-26 DIAGNOSIS — S01112A Laceration without foreign body of left eyelid and periocular area, initial encounter: Secondary | ICD-10-CM | POA: Diagnosis not present

## 2018-05-26 DIAGNOSIS — R51 Headache: Secondary | ICD-10-CM | POA: Insufficient documentation

## 2018-05-26 MED ORDER — TETANUS-DIPHTH-ACELL PERTUSSIS 5-2.5-18.5 LF-MCG/0.5 IM SUSP
0.5000 mL | Freq: Once | INTRAMUSCULAR | Status: AC
  Administered 2018-05-26: 0.5 mL via INTRAMUSCULAR
  Filled 2018-05-26: qty 0.5

## 2018-05-26 MED ORDER — LIDOCAINE HCL (PF) 1 % IJ SOLN
5.0000 mL | Freq: Once | INTRAMUSCULAR | Status: DC
  Filled 2018-05-26: qty 5

## 2018-05-26 NOTE — ED Notes (Signed)
ED Provider at bedside. 

## 2018-05-26 NOTE — ED Triage Notes (Signed)
Pt arrived via ems from nursing facility. Pt non ambulatory at bedside and tonight rolled out of bed causing laceration to left eyebrow. Pt has hx of dementia.

## 2018-05-26 NOTE — Discharge Instructions (Signed)
Please keep your wounds clean and dry and follow-up with your primary care physician in 2 days for recheck.  The sutures I put in today are absorbable and do not have to be removed.  Return to the emergency department for any concerns.  It was a pleasure to take care of you today, and thank you for coming to our emergency department.  If you have any questions or concerns before leaving please ask the nurse to grab me and I'm more than happy to go through your aftercare instructions again.  If you were prescribed any opioid pain medication today such as Norco, Vicodin, Percocet, morphine, hydrocodone, or oxycodone please make sure you do not drive when you are taking this medication as it can alter your ability to drive safely.  If you have any concerns once you are home that you are not improving or are in fact getting worse before you can make it to your follow-up appointment, please do not hesitate to call [phone] and come back for further evaluation.  Robin Brittle, MD  No results found for this or any previous visit. Ct Head Wo Contrast  Result Date: 05/26/2018 CLINICAL DATA:  Fall out of bed. Laceration to left eyebrow. Dementia. EXAM: CT HEAD WITHOUT CONTRAST CT CERVICAL SPINE WITHOUT CONTRAST TECHNIQUE: Multidetector CT imaging of the head and cervical spine was performed following the standard protocol without intravenous contrast. Multiplanar CT image reconstructions of the cervical spine were also generated. COMPARISON:  None. FINDINGS: CT HEAD FINDINGS Brain: Advanced for age cerebral and cerebellar atrophy. Moderate low density in the periventricular white matter likely related to small vessel disease. Somewhat more focal periventricular white matter hypoattenuation left frontal lobe could relate to remote infarct. Ventriculomegaly is likely secondary to atrophy. No acute infarct, hemorrhage, intra-axial, or extra-axial fluid collection. Vascular: Intracranial atherosclerosis. Skull: Left  periorbital and supraorbital soft tissue swelling. No skull fractures. Sinuses/Orbits: Normal appearance of the globes, without retrobulbar hemorrhage. Clear paranasal sinuses and mastoid air cells. Other: None. CT CERVICAL SPINE FINDINGS Alignment: Spinal visualization through the bottom of T1. Maintenance of vertebral body height. Trace C4-5 anterolisthesis is most likely degenerative. Skull base and vertebrae: Skull base intact. Only degenerative irregularity about the C1-2 articulation. No acute fracture identified. Spondylosis, most significant at C5-6 and C6-7 with endplate osteophytes. Prominent osteophyte involves the left-sided lamina at C6. Soft tissues and spinal canal: Prevertebral soft tissues are within normal limits. Disc levels: Loss of intervertebral disc height, most significant at C5-6 and C6-7. Areas of central canal stenosis secondary to disc osteophyte complexes at C3-4, C5-6, and C6-7. Upper chest: No apical pneumothorax. Other: None. IMPRESSION: 1. Left periorbital and supraorbital soft tissue swelling. No acute intracranial abnormality. Significantly age advanced cerebral/cerebellar atrophy. Periventricular white matter small vessel ischemic change, with suspicion of left frontal remote infarct. 2. Advanced cervical spondylosis, without fracture. Electronically Signed   By: Jeronimo Greaves M.D.   On: 05/26/2018 02:30   Ct Cervical Spine Wo Contrast  Result Date: 05/26/2018 CLINICAL DATA:  Fall out of bed. Laceration to left eyebrow. Dementia. EXAM: CT HEAD WITHOUT CONTRAST CT CERVICAL SPINE WITHOUT CONTRAST TECHNIQUE: Multidetector CT imaging of the head and cervical spine was performed following the standard protocol without intravenous contrast. Multiplanar CT image reconstructions of the cervical spine were also generated. COMPARISON:  None. FINDINGS: CT HEAD FINDINGS Brain: Advanced for age cerebral and cerebellar atrophy. Moderate low density in the periventricular white matter likely  related to small vessel disease. Somewhat more focal periventricular white  matter hypoattenuation left frontal lobe could relate to remote infarct. Ventriculomegaly is likely secondary to atrophy. No acute infarct, hemorrhage, intra-axial, or extra-axial fluid collection. Vascular: Intracranial atherosclerosis. Skull: Left periorbital and supraorbital soft tissue swelling. No skull fractures. Sinuses/Orbits: Normal appearance of the globes, without retrobulbar hemorrhage. Clear paranasal sinuses and mastoid air cells. Other: None. CT CERVICAL SPINE FINDINGS Alignment: Spinal visualization through the bottom of T1. Maintenance of vertebral body height. Trace C4-5 anterolisthesis is most likely degenerative. Skull base and vertebrae: Skull base intact. Only degenerative irregularity about the C1-2 articulation. No acute fracture identified. Spondylosis, most significant at C5-6 and C6-7 with endplate osteophytes. Prominent osteophyte involves the left-sided lamina at C6. Soft tissues and spinal canal: Prevertebral soft tissues are within normal limits. Disc levels: Loss of intervertebral disc height, most significant at C5-6 and C6-7. Areas of central canal stenosis secondary to disc osteophyte complexes at C3-4, C5-6, and C6-7. Upper chest: No apical pneumothorax. Other: None. IMPRESSION: 1. Left periorbital and supraorbital soft tissue swelling. No acute intracranial abnormality. Significantly age advanced cerebral/cerebellar atrophy. Periventricular white matter small vessel ischemic change, with suspicion of left frontal remote infarct. 2. Advanced cervical spondylosis, without fracture. Electronically Signed   By: Jeronimo GreavesKyle  Talbot M.D.   On: 05/26/2018 02:30

## 2018-05-26 NOTE — ED Provider Notes (Signed)
[facility] Emergency Department Provider Note  ____________________________________________   First MD Initiated Contact with Patient 05/26/18 0129     (approximate)  I have reviewed the triage vital signs and the nursing notes.   HISTORY  Chief Complaint Fall    HPI Robin Archer is a 72 y.o. female comes to the emergency department via EMS after rolling out of bed and striking the left side of her face on the ground.  She sustained a small laceration.  Did not lose consciousness.  She does have a sudden onset moderate severity headache that is slowly improving with time.  No nausea or vomiting.  No double vision or blurred vision.    History reviewed. No pertinent past medical history.  There are no active problems to display for this patient.   History reviewed. No pertinent surgical history.  Prior to Admission medications   Not on File    Allergies Patient has no allergy information on record.  No family history on file.  Social History Social History   Tobacco Use  . Smoking status: Not on file  Substance Use Topics  . Alcohol use: Not on file  . Drug use: Not on file    Review of Systems Constitutional: No fever/chills Cardiovascular: Denies chest pain. Respiratory: Denies shortness of breath. Gastrointestinal: No abdominal pain.  No nausea, no vomiting.   Skin: Positive for wound Neurological: Positive for headache  ____________________________________________   PHYSICAL EXAM:  VITAL SIGNS: ED Triage Vitals  Enc Vitals Group     BP 05/26/18 0121 (!) 190/109     Pulse Rate 05/26/18 0121 (!) 108     Resp 05/26/18 0121 16     Temp 05/26/18 0121 98.1 F (36.7 C)     Temp Source 05/26/18 0121 Oral     SpO2 05/26/18 0121 97 %     Weight 05/26/18 0122 170 lb (77.1 kg)     Height 05/26/18 0122 5\' 8"  (1.727 m)     Head Circumference --      Peak Flow --      Pain Score --      Pain Loc --      Pain Edu? --    Excl. in GC? --     Constitutional: Pleasant cooperative speaks in full clear sentences Eyes: PERRL EOMI. Head: 2 lacerations noted.  One is 1 cm over the bridge of the nose the second is 4 cm over her left brow. Nose: No congestion/rhinnorhea. Mouth/Throat: No trismus Neck: No stridor.  No midline tenderness or step-offs Cardiovascular: Normal rate, regular rhythm. Grossly normal heart sounds.  Good peripheral circulation. Respiratory: Normal respiratory effort.  No retractions. Lungs CTAB and moving good air Gastrointestinal: Soft nontender Musculoskeletal: No lower extremity edema   Neurologic:No gross focal neurologic deficits are appreciated. Skin: Wounds as above   ____________________________________________   DIFFERENTIAL includes but not limited to  Mechanical fall, syncope, dehydration, laceration, intracerebral hemorrhage, cervical spine fracture ____________________________________________   LABS (all labs ordered are listed, but only abnormal results are displayed)  Labs Reviewed - No data to display   __________________________________________  EKG   ____________________________________________  RADIOLOGY  CT of the head neck reviewed by me with no acute disease ____________________________________________   PROCEDURES  Procedure(s) performed: Yes  .Marland KitchenLaceration Repair Date/Time: 05/26/2018 3:07 AM Performed by: Merrily Brittle, MD Authorized by: Merrily Brittle, MD   Consent:    Consent obtained:  Verbal   Consent given by:  Patient   Risks discussed:  Infection, pain, retained foreign body, poor cosmetic result and poor wound healing Anesthesia (see MAR for exact dosages):    Anesthesia method:  Local infiltration   Local anesthetic:  Lidocaine 1% WITH epi Laceration details:    Location:  Face   Face location:  L eyebrow   Length (cm):  4 Repair type:    Repair type:  Intermediate Pre-procedure details:    Preparation:  Patient was  prepped and draped in usual sterile fashion Exploration:    Hemostasis achieved with:  Direct pressure and epinephrine   Wound exploration: entire depth of wound probed and visualized     Contaminated: no   Treatment:    Area cleansed with:  Saline   Amount of cleaning:  Extensive   Irrigation solution:  Sterile saline   Visualized foreign bodies/material removed: no   Subcutaneous repair:    Suture size:  5-0   Suture material:  Vicryl   Suture technique:  Simple interrupted   Number of sutures:  1 Skin repair:    Repair method:  Sutures   Suture size:  5-0   Wound skin closure material used: Vicryl.   Suture technique:  Simple interrupted   Number of sutures:  5 Approximation:    Approximation:  Close Post-procedure details:    Dressing:  Sterile dressing   Patient tolerance of procedure:  Tolerated well, no immediate complications Comments:     I did undermine the wound and trim it slightly to make it more amenable to cosmetic repair.  Anesthetized and washed out with copious normal saline and explored and a full bloodless field no foreign bodies identified.  Closed in 2 layers with good cosmesis. Marland Kitchen..Laceration Repair Date/Time: 05/26/2018 3:07 AM Performed by: Merrily Brittleifenbark, Mintie Witherington, MD Authorized by: Merrily Brittleifenbark, Thomos Domine, MD   Consent:    Consent obtained:  Verbal   Consent given by:  Patient   Risks discussed:  Infection, pain, retained foreign body, poor cosmetic result and poor wound healing Anesthesia (see MAR for exact dosages):    Anesthesia method:  Local infiltration   Local anesthetic:  Lidocaine 1% w/o epi Laceration details:    Location:  Face   Face location:  Nose   Length (cm):  1 Repair type:    Repair type:  Simple Exploration:    Hemostasis achieved with:  Direct pressure   Wound exploration: entire depth of wound probed and visualized     Contaminated: no   Treatment:    Area cleansed with:  Saline   Amount of cleaning:  Extensive   Irrigation solution:   Sterile saline   Visualized foreign bodies/material removed: no   Skin repair:    Repair method:  Sutures   Suture size:  5-0   Wound skin closure material used: Vicryl.   Suture technique:  Simple interrupted Approximation:    Approximation:  Close Post-procedure details:    Dressing:  Sterile dressing   Patient tolerance of procedure:  Tolerated well, no immediate complications Comments:     1 suture placed in her nasal laceration    Critical Care performed: no  ____________________________________________   INITIAL IMPRESSION / ASSESSMENT AND PLAN / ED COURSE  Pertinent labs & imaging results that were available during my care of the patient were reviewed by me and considered in my medical decision making (see chart for details).   As part of my medical decision making, I reviewed the following data within the electronic MEDICAL RECORD NUMBER History obtained from family if available, nursing  notes, old chart and ekg, as well as notes from prior ED visits.  Patient comes the emergency department after mechanical fall sustaining a laceration to the bridge of her nose and to the left brow.  CTs are reassuring and tetanus updated.  I washed out both wounds and closed with good cosmesis.  Discharged home in improved condition.      ____________________________________________   FINAL CLINICAL IMPRESSION(S) / ED DIAGNOSES  Final diagnoses:  Fall, initial encounter  Facial laceration, initial encounter  Laceration of nose, initial encounter      NEW MEDICATIONS STARTED DURING THIS VISIT:  There are no discharge medications for this patient.    Note:  This document was prepared using Dragon voice recognition software and may include unintentional dictation errors.     Merrily Brittle, MD 05/26/18 [phone]

## 2018-05-26 NOTE — ED Notes (Signed)
Patient resting quietly with eyes closed in no acute distress.  

## 2018-05-29 ENCOUNTER — Encounter: Payer: Self-pay | Admitting: Emergency Medicine

## 2018-11-04 ENCOUNTER — Non-Acute Institutional Stay: Payer: Medicare Other | Admitting: Student

## 2018-11-04 VITALS — BP 120/70 | HR 76 | Resp 16 | Wt 138.2 lb

## 2018-11-04 DIAGNOSIS — Z515 Encounter for palliative care: Secondary | ICD-10-CM

## 2018-11-04 NOTE — Progress Notes (Signed)
Therapist, nutritional Palliative Care Consult Note Telephone: [phone]  Fax: [phone]  PATIENT NAME: Robin Archer DOB: 02/22/1946 MRN: [id number]  PRIMARY CARE PROVIDER:   Dr. Drue Flirt  REFERRING PROVIDER: Dr. Drue Flirt  RESPONSIBLE PARTY: Daughter, [name]    ASSESSMENT: [name] is sitting up to wheel chair upon arrival. She smiles, nods her head yes and no to most questions. No acute distress noted. Discussed ongoing goals of care. Discussed patient with facility nurse Patti. Discussed patient with daughter [name] via phone.       RECOMMENDATIONS and PLAN:  1.CODE STATUS. DNR is on chart.  2. Medical goals of therapy: At this time, goals of therapy are focused on comfort and symptom management. Will continue to follow and refer for Hospice admission assessment when patient meets criteria per guidelines.  3. Symptom management: currently no unmanaged symptoms. 4. Discharge Planning: Patient will continue to reside at [facility].  5. Emotional/spiritual support: Discussed with staff nurse Clayborne Dana, daughter [name] via phone. Facility staff and family are encouraged to call with questions.   Palliative Care to continue to follow for emotional/spiritual support, ongoing discussions trajectory of chronic disease progression, medical goals of therapy, monitor for symptoms with management, and reduce ED and hospitalizations with recommendations.  Palliative Medicine to follow up in 8 weeks or sooner, if needed.    I spent 25 minutes providing this consultation,  from 2:15pm to 2:40pm. More than 50% of the time in this consultation was spent coordinating communication.   HISTORY OF PRESENT ILLNESS:  Robin Archer is a 73 y.o. female with multiple medical problems including myasthenia gravis, dementia without behavioral disturbance, osteoporosis, constipation, pathological fracture, difficulty walking, contracture, vitamin d deficiency,  vitamin B deficiency, vitamin C deficiency, magnesium deficiency, essential hypertension, diabetes mellitus, atrial fibrillation, chronic pain, muscle spasms, edema, dysphagia, major depressive disorder, iron deficiency and gerd. She is seen for follow up visit today; Palliative Care was asked to help address ongoing goals of care. Ms. Timp is dependent for all activities of daily living. Maxi lift is used for transfers. She is out of bed daily to wheel chair. Good appetite reported; she is able to feed herself per staff. She is receiving a mechanical soft diet with thin liquids. No weight loss reported; she was 137.5 pounds 06/2018; current weight 138.2 pounds.No sleep difficulty reported. She shakes her head no when asked if she is having pain or shortness of breath. New order on 10/23/2018 PT clarification order to eval and treat 5 x week x 2 weeks for positioning, contracture management; OT eval and treatment. Order on 10/14/2018 ST eval for communication skills with patient baseline, moderate to severe expressive aphasia. No recent infections reported. No recent falls; no recent emergency room visits or hospitalizations.    CODE STATUS: DNR   PPS: 30% HOSPICE ELIGIBILITY/DIAGNOSIS: TBD  PAST MEDICAL HISTORY:  Past Medical History:  Diagnosis Date  . DM (diabetes mellitus) (HCC)   . GERD (gastroesophageal reflux disease)   . Hyperlipidemia   . Hypertension   . Memory loss   . Myasthenia gravis (HCC)   . Spinal stenosis   . Vitamin B deficiency     SOCIAL HX:  Social History   Tobacco Use  . Smoking status: Never Smoker  Substance Use Topics  . Alcohol use: No    Alcohol/week: 0.0 standard drinks    ALLERGIES: No Known Allergies   PERTINENT MEDICATIONS:  Outpatient Encounter Medications as of 11/04/2018  Medication Sig  .  acetaminophen (TYLENOL) 325 MG tablet Take 650 mg by mouth every 6 (six) hours as needed.   . bisacodyl (DULCOLAX) 10 MG suppository Place 10 mg rectally 4 (four)  times a week. Insert 2 suppositories at bedtime on Sunday, Monday, Wednesday, Friday.  . carvedilol (COREG) 12.5 MG tablet Take 12.5 mg by mouth 2 (two) times daily.  . cholecalciferol (VITAMIN D) 1000 UNITS tablet Take 1,000 Units by mouth daily.  . insulin glargine (LANTUS) 100 UNIT/ML injection Inject 30 Units into the skin 2 (two) times daily. Lantus 30 units qhs and Lantus 48 units each morning.  . insulin regular (NOVOLIN R,HUMULIN R) 100 units/mL injection Inject 10 Units into the skin daily after lunch.  . lisinopril (PRINIVIL,ZESTRIL) 10 MG tablet Take 10 mg by mouth daily.  . ondansetron (ZOFRAN-ODT) 4 MG disintegrating tablet Take 4 mg by mouth every 8 (eight) hours as needed for nausea or vomiting.  . polyethylene glycol (MIRALAX / GLYCOLAX) packet Take 17 g by mouth 2 (two) times daily.  . protein supplement (RESOURCE BENEPROTEIN) POWD Take 1 scoop by mouth daily.  . sertraline (ZOLOFT) 50 MG tablet Take 50 mg by mouth daily.  . magnesium oxide (MAG-OX) 400 MG tablet Take 800 mg by mouth 2 (two) times daily.  . [DISCONTINUED] alendronate (FOSAMAX) 70 MG tablet Take 70 mg by mouth once a week. Pt takes on Tuesday.  . [DISCONTINUED] aspirin 81 MG chewable tablet Chew 81 mg by mouth daily.  . [DISCONTINUED] citalopram (CELEXA) 20 MG tablet Take 20 mg by mouth daily.  . [DISCONTINUED] donepezil (ARICEPT) 5 MG tablet Take 5 mg by mouth at bedtime.  . [DISCONTINUED] ferrous sulfate 325 (65 FE) MG tablet Take 325 mg by mouth 2 (two) times daily.  . [DISCONTINUED] gabapentin (NEURONTIN) 300 MG capsule Take 300 mg by mouth at bedtime.  . [DISCONTINUED] HYDROcodone-acetaminophen (NORCO) 5-325 MG per tablet Take 1 tablet by mouth every 6 (six) hours as needed for moderate pain. (Patient not taking: Reported on 11/04/2018)  . [DISCONTINUED] insulin lispro (HUMALOG) 100 UNIT/ML injection Inject 1-4 Units into the skin 2 (two) times daily. Pt uses four units in the morning before breakfast, two units  before lunch, and one unit before dinner.  . [DISCONTINUED] metFORMIN (GLUCOPHAGE) 1000 MG tablet Take 1,000 mg by mouth 2 (two) times daily.  . [DISCONTINUED] metoprolol tartrate (LOPRESSOR) 25 MG tablet Take 1 tablet (25 mg total) by mouth 2 (two) times daily.  . [DISCONTINUED] omeprazole (PRILOSEC) 20 MG capsule Take 20 mg by mouth daily.  . [DISCONTINUED] vitamin B-12 (CYANOCOBALAMIN) 1000 MCG tablet Take 1,000 mcg by mouth every other day.  . [DISCONTINUED] vitamin C (ASCORBIC ACID) 500 MG tablet Take 500 mg by mouth daily.   No facility-administered encounter medications on file as of 11/04/2018.     PHYSICAL EXAM:   General: NAD, frail appearing Cardiovascular: regular rate and rhythm Pulmonary: clear ant fields Abdomen: soft, nontender, + bowel sounds GU: no suprapubic tenderness Extremities: trace edema to lower extremities Skin: no rashes Neurological: Weakness but otherwise nonfocal  Luella Cook, NP

## 2019-01-16 ENCOUNTER — Non-Acute Institutional Stay: Payer: Medicare Other | Admitting: Primary Care

## 2019-01-16 ENCOUNTER — Other Ambulatory Visit: Payer: Self-pay

## 2019-01-16 DIAGNOSIS — Z515 Encounter for palliative care: Secondary | ICD-10-CM

## 2019-01-16 NOTE — Progress Notes (Signed)
Therapist, [facility] Consult Note Telephone: [phone]  Fax: [phone]  TELEHEALTH VISIT STATEMENT Due to the COVID-19 crisis, this visit was done via telemedicine from my office. It was initiated and consented to by this patient and/or family.  PATIENT NAME: Robin Archer DOB: [birth year] MRN: [id number]  PRIMARY CARE PROVIDER:   Drue FlirtHenry-Marika Mahaffy, Carol, MD  REFERRING PROVIDER: Drue FlirtHenry-Dessie Tatem, Carol, MD [address] [place] [address]  RESPONSIBLE PARTY:  Extended Emergency Contact Information Primary Emergency Contact: Stutts,[name] Address: [address]          [place], [place] [address] [place] Mobile Phone: [phone] Relation: Daughter Secondary Emergency Contact: Stephania FragminGILLEY,TERESA A Address: [address]          [place], [place] [address] Home Phone: [phone] Work Phone: [phone] Relation: None  Palliative Care was asked to follow patient by consultation request of Dr. Drue FlirtHenry-Layaan Mott, Carol, MD. This is a follow up visit. It was done per telemedicine with SNF staff, pt and myself.  ASSESSMENT and RECOMMENDATIONS:   1. Goals of Care: DNR, MOST form outlines comfort, abx, iv defined, no feeding tube. T/c to POA [name], no changes made. No questions or concerns voiced by POA. I introduced myself as new palliative NP in building.  2. Constipation: Recommend scheduling twice daily senna, already on BID miralax but continues to be constipated.  Caregivers endorse pt states she has constipation. She was given a laxative today after no bm x 3 days.   I spent 15 minutes providing this consultation,  from 1300 to 1315. More than 50% of the time in this consultation was spent coordinating communication.   Palliative care will continue to follow for goals of care clarification and symptom management. Return 8 weeks or prn.  HISTORY OF PRESENT ILLNESS:  Robin HockCarrie Ann Archer is a 73 y.o. year old female with multiple  medical problems including DM. MG, dementia, constipation, HTN. Palliative Care was asked to help address goals of care.   CODE STATUS: DNR, MOST form comfort, abx, iv defined, no feeding tube.  PPS: 30% HOSPICE ELIGIBILITY/DIAGNOSIS: TBD  PAST MEDICAL HISTORY:  Past Medical History:  Diagnosis Date  . DM (diabetes mellitus) (HCC)   . GERD (gastroesophageal reflux disease)   . Hyperlipidemia   . Hypertension   . Memory loss   . Myasthenia gravis (HCC)   . Spinal stenosis   . Vitamin B deficiency     SOCIAL HX:  Social History   Tobacco Use  . Smoking status: Never Smoker  Substance Use Topics  . Alcohol use: No    Alcohol/week: 0.0 standard drinks    ALLERGIES: No Known Allergies   PERTINENT MEDICATIONS:  Outpatient Encounter Medications as of 01/16/2019  Medication Sig  . acetaminophen (TYLENOL) 325 MG tablet Take 650 mg by mouth every 6 (six) hours as needed.   . bisacodyl (DULCOLAX) 10 MG suppository Place 10 mg rectally 4 (four) times a week. Insert 2 suppositories at bedtime on Sunday, Monday, Wednesday, Friday.  . carvedilol (COREG) 12.5 MG tablet Take 12.5 mg by mouth 2 (two) times daily.  . cholecalciferol (VITAMIN D) 1000 UNITS tablet Take 1,000 Units by mouth daily.  . insulin glargine (LANTUS) 100 UNIT/ML injection Inject 30 Units into the skin 2 (two) times daily. Lantus 30 units qhs and Lantus 48 units each morning.  . insulin regular (NOVOLIN R,HUMULIN R) 100 units/mL injection Inject 10 Units into the skin daily after lunch.  . lisinopril (  PRINIVIL,ZESTRIL) 10 MG tablet Take 10 mg by mouth daily.  . magnesium oxide (MAG-OX) 400 MG tablet Take 800 mg by mouth 2 (two) times daily.  . ondansetron (ZOFRAN-ODT) 4 MG disintegrating tablet Take 4 mg by mouth every 8 (eight) hours as needed for nausea or vomiting.  . polyethylene glycol (MIRALAX / GLYCOLAX) packet Take 17 g by mouth 2 (two) times daily.  . protein supplement (RESOURCE BENEPROTEIN) POWD Take 1 scoop by  mouth daily.  . sertraline (ZOLOFT) 50 MG tablet Take 50 mg by mouth daily.   No facility-administered encounter medications on file as of 01/16/2019.     PHYSICAL EXAM:   137.5 wt, 2 pound loss in a month  General: NAD, frail appearing, WNWD Cardiovascular: denies chest pain Pulmonary: no SOB, no cough Abdomen: appetite diminished, bm constipation, given a preparation for bm today GU: denies dysuria, endorses incontinence Extremities: no edema, no joint deformities on gross exam Skin: no rashes on gross exam Neurological: Weakness, gets oob to w/c, ordinarily to meals, no falls  Marijo File DNP, AGPCNP-BC

## 2019-02-20 ENCOUNTER — Other Ambulatory Visit: Payer: Self-pay

## 2019-02-20 ENCOUNTER — Non-Acute Institutional Stay: Payer: Medicare Other | Admitting: Primary Care

## 2019-03-06 ENCOUNTER — Non-Acute Institutional Stay: Payer: Medicare Other | Admitting: Primary Care

## 2019-03-06 ENCOUNTER — Other Ambulatory Visit: Payer: Self-pay

## 2019-03-06 DIAGNOSIS — Z515 Encounter for palliative care: Secondary | ICD-10-CM

## 2019-03-06 NOTE — Progress Notes (Signed)
Therapist, [facility] Consult Note Telephone: [phone](336) [phone]  Fax: [phone]  TELEHEALTH VISIT STATEMENT Due to the COVID-19 crisis, this visit was done via telemedicine from my office. It was initiated and consented to by this patient and/or family.  PATIENT NAME: Robin Archer DOB: Jun 28, 1946 MRN: [id number]  PRIMARY CARE PROVIDER:   Drue FlirtHenry-Shamiyah Ngu, Carol, MD [phone]  REFERRING PROVIDER:  Drue FlirtHenry-Jago Carton, Carol, MD [address] [place],  [place] [address] [phone]  RESPONSIBLE PARTY:   Extended Emergency Contact Information Primary Emergency Contact: Stutts,[name] Address: [address]          [place], [place] [address] [place] Mobile Phone: [phone] Relation: Daughter Secondary Emergency Contact: Stephania FragminGILLEY,TERESA A Address: [address]          [place], [place] [address] Home Phone: [phone] Work Phone: [phone] Relation: None  Palliative Care was asked to follow this patient by consultation request of Drue FlirtHenry-Drinda Belgard, Carol, MD. This is a follow up visit. Present were SNF Staff, patient and myself.  ASSESSMENT AND RECOMMENDATIONS:   1. Goals of Care: Maximize quality of life and symptom management.  2. Symptom Management:   Pain: Well managed on percocet prn q 6 hours. Last dose was a week ago. Constipation:  Last bm 6/24. Recommend senna bid added to regimen. Fx left humerus: Healing, about a month old. No s/sx pain, is able to use. Nutrition: Staff reports 18% weight loss in 2 months, This is quite concerning. Please repeat weight to verify. Patient needs supplemental nutrition and help with meal set up and feeding if patient does not eat at least 50%. Glucose Control: Some evening spikes to 300's but generally well controlled. Continue to monitor if higher readings become more of a norm.  3. Family /Caregiver/Community Supports: Daughter calls, no window visits by family.Cared for in SNF by staff.   4. Cognitive / Functional decline: At baseline for cognition and function.  5. Advanced Care Directive: DNR, MOST form comfort, abx, iv defined, no feeding tube, not [facility]. Will upload DNR and discuss MOST with POA.  6. Follow up Palliative Care Visit: Palliative care will continue to follow for goals of care clarification and symptom management. Return 2-4 weeks or prn.  I spent 25 minutes providing this consultation,  from 1300 to 1325. More than 50% of the time in this consultation was spent coordinating communication.   HISTORY OF PRESENT ILLNESS:  Robin Archer is a 73 y.o. year old female with multiple medical problems including DM. MG, dementia, constipation, HTN.. Palliative Care was asked to help address goals of care.   CODE STATUS: DNR, MOST form comfort, abx, iv defined, no feeding tube per staff  PPS: 30% HOSPICE ELIGIBILITY/DIAGNOSIS: TBD  PAST MEDICAL HISTORY:  Past Medical History:  Diagnosis Date  . DM (diabetes mellitus) (HCC)   . GERD (gastroesophageal reflux disease)   . Hyperlipidemia   . Hypertension   . Memory loss   . Myasthenia gravis (HCC)   . Spinal stenosis   . Vitamin B deficiency     SOCIAL HX:  Social History   Tobacco Use  . Smoking status: Never Smoker  Substance Use Topics  . Alcohol use: No    Alcohol/week: 0.0 standard drinks    ALLERGIES: No Known Allergies   PERTINENT MEDICATIONS:  Outpatient Encounter Medications as of 03/06/2019  Medication Sig  . acetaminophen (TYLENOL) 325 MG tablet Take 650 mg by mouth every 6 (six) hours  as needed.   . bisacodyl (DULCOLAX) 10 MG suppository Place 10 mg rectally 4 (four) times a week. Insert 2 suppositories at bedtime on Sunday, Monday, Wednesday, Friday.  . carvedilol (COREG) 12.5 MG tablet Take 12.5 mg by mouth 2 (two) times daily.  . cholecalciferol (VITAMIN D) 1000 UNITS tablet Take 1,000 Units by mouth daily.  . insulin glargine (LANTUS) 100 UNIT/ML injection Inject 30  Units into the skin 2 (two) times daily. Lantus 30 units qhs and Lantus 48 units each morning.  . insulin regular (NOVOLIN R,HUMULIN R) 100 units/mL injection Inject 10 Units into the skin daily after lunch.  . lisinopril (PRINIVIL,ZESTRIL) 10 MG tablet Take 10 mg by mouth daily.  . magnesium oxide (MAG-OX) 400 MG tablet Take 800 mg by mouth 2 (two) times daily.  . ondansetron (ZOFRAN-ODT) 4 MG disintegrating tablet Take 4 mg by mouth every 8 (eight) hours as needed for nausea or vomiting.  . polyethylene glycol (MIRALAX / GLYCOLAX) packet Take 17 g by mouth 2 (two) times daily.  . protein supplement (RESOURCE BENEPROTEIN) POWD Take 1 scoop by mouth daily.  . sertraline (ZOLOFT) 50 MG tablet Take 50 mg by mouth daily.   No facility-administered encounter medications on file as of 03/06/2019.     PHYSICAL EXAM/ROS:  98.3-104-18  132/70  PO2 = 97% Current and past weights:  113. 2 on 6/17, down from 138.5 lbs  In mid April 2020.  This is 18% weight loss in 2 months.   General: NAD, frail appearing, thin, pale Cardiovascular: no chest pain reported, no edema Pulmonary: no cough, no increased SOB, Room air Abdomen: appetite fair to moderate, report takes 60% of meals, endorses constipation, medium bm yesterday, incontinent of bowel GU: denies dysuria, incontinent of urine MSK:  Fx of L humerus, does not appear painful. OOB with total assistance Skin: no rashes or wounds reported, preventative dressing on sacrum Neurological: Weakness,  MG history, no falls, changes in LOC   Cyndia Skeeters DNP AGPCNP-BC

## 2019-03-27 ENCOUNTER — Non-Acute Institutional Stay: Payer: Medicare Other | Admitting: Primary Care

## 2019-03-27 ENCOUNTER — Other Ambulatory Visit: Payer: Self-pay

## 2019-03-27 DIAGNOSIS — Z515 Encounter for palliative care: Secondary | ICD-10-CM

## 2019-03-27 NOTE — Progress Notes (Signed)
Therapist, [facility] Consult Note Telephone: [phone](336) [phone]  Fax: [phone]  TELEHEALTH VISIT STATEMENT Due to the COVID-19 crisis, this visit was done via telemedicine from my office. It was initiated and consented to by this patient and/or family.  PATIENT NAME: Robin Archer DOB: [birth year] MRN: [id number]  PRIMARY CARE PROVIDER:   Drue FlirtHenry-Hriday Stai, Carol, MD [phone]  REFERRING PROVIDER:  Drue FlirtHenry-Shakiah Wester, Carol, MD [address] [place],  [place] [phone]  RESPONSIBLE PARTY:   Extended Emergency Contact Information Primary Emergency Contact: Archer,Robin Address: [address]          [place], [place] [address] [place] Mobile Phone: [phone] Relation: Daughter Secondary Emergency Contact: Stephania FragminGILLEY,TERESA A Address: [address]          [place], [place] [address] Home Phone: [phone] Work Phone: [phone] Relation: None  Palliative Care was asked to follow this patient by consultation request of Drue FlirtHenry-Trevor Duty, Carol, MD. This is a follow up visit.  ASSESSMENT AND RECOMMENDATIONS:   1. Goals of Care: Maximize quality of life and symptom management.  2. Symptom Management:   Dexterity: Recommend OT and PT referral for assessment to see if patient will benefit. She likely needs some ROM and rehab plan. Arm should be healed, but not using it much due to immobility.  Nutrition: Recommend  magic cups on lunch and supper trays for weight loss and anorexia. Recommend weekly weights so we can see if she is continuing to lose weight. If so she may be appropriate to consider hospice referral.  Pain: Resolved from injury. Use meds PRN.  Constipation: Normal, medium to large and qod. Continue current bowel protocol.   3. Family /Caregiver/Community Supports: Daughters Robin and Robin Archer live in the area. T/c to try to reach Robin, no answer. Daughter Robin Archer spoke with me about her mother and how much she  misses her. I will follow up again next visit if wt loss continue to explore goals of care.  4. Cognitive / Functional decline: At baseline, eating some better per staff but had not been b/c she disliked being fed.  5. Advanced Care Directive: DNR, comfort measures, antibiotics and iV determine use, no feeding tube.   6. Follow up Palliative Care Visit: Palliative care will continue to follow for goals of care clarification and symptom management. Return 4 weeks or prn.  I spent 25 minutes providing this consultation,  from 1345 to 1410. More than 50% of the time in this consultation was spent coordinating communication.   HISTORY OF PRESENT ILLNESS:  Robin Archer is a 73 y.o. year old female with multiple medical problems including DM. MG, dementia, constipation, HTN. Palliative Care was asked to help address goals of care.   CODE STATUS: DNR, MOST form comfort, abx, iv defined, no feeding tube per   PPS: 30% HOSPICE ELIGIBILITY/DIAGNOSIS: TBD  PAST MEDICAL HISTORY:  Past Medical History:  Diagnosis Date  . DM (diabetes mellitus) (HCC)   . GERD (gastroesophageal reflux disease)   . Hyperlipidemia   . Hypertension   . Memory loss   . Myasthenia gravis (HCC)   . Spinal stenosis   . Vitamin B deficiency     SOCIAL HX:  Social History   Tobacco Use  . Smoking status: Never Smoker  Substance Use Topics  . Alcohol use: No    Alcohol/week: 0.0 standard drinks    ALLERGIES: No Known Allergies   PERTINENT MEDICATIONS:  Outpatient Encounter Medications as of  03/27/2019  Medication Sig  . acetaminophen (TYLENOL) 325 MG tablet Take 650 mg by mouth every 6 (six) hours as needed.   . bisacodyl (DULCOLAX) 10 MG suppository Place 10 mg rectally 4 (four) times a week. Insert 2 suppositories at bedtime on Sunday, Monday, Wednesday, Friday.  . carvedilol (COREG) 12.5 MG tablet Take 12.5 mg by mouth 2 (two) times daily.  . cholecalciferol (VITAMIN D) 1000 UNITS tablet Take 1,000 Units  by mouth daily.  . insulin glargine (LANTUS) 100 UNIT/ML injection Inject 30 Units into the skin 2 (two) times daily. Lantus 30 units qhs and Lantus 48 units each morning.  . insulin regular (NOVOLIN R,HUMULIN R) 100 units/mL injection Inject 10 Units into the skin daily after lunch.  . lisinopril (PRINIVIL,ZESTRIL) 10 MG tablet Take 10 mg by mouth daily.  . magnesium oxide (MAG-OX) 400 MG tablet Take 800 mg by mouth 2 (two) times daily.  . ondansetron (ZOFRAN-ODT) 4 MG disintegrating tablet Take 4 mg by mouth every 8 (eight) hours as needed for nausea or vomiting.  . polyethylene glycol (MIRALAX / GLYCOLAX) packet Take 17 g by mouth 2 (two) times daily.  . protein supplement (RESOURCE BENEPROTEIN) POWD Take 1 scoop by mouth daily.  . sertraline (ZOLOFT) 50 MG tablet Take 50 mg by mouth daily.   No facility-administered encounter medications on file as of 03/27/2019.     PHYSICAL EXAM/ROS:   Current and past weights: 104 lb, reduction from 113 lb last month. SNF has 137.5 in October 133 lbs on 11/16/2014. 25% weight loss.  General: NAD, frail, thin Cardiovascular: no chest pain reported, no edema reported Pulmonary: no cough, no increased SOB Abdomen: appetite good 75%, endorses constipation, incontinent of bowel GU: denies dysuria, incontinent of urine MSK:  Broken arm, must be fed but often refuses. Has not been doing therapy. Skin: No rashes or wounds reported. Neurological: Weakness, MG deficits  Cyndia Skeeters DNP AGPCNP-BC

## 2019-04-24 ENCOUNTER — Other Ambulatory Visit: Payer: Self-pay

## 2019-04-24 ENCOUNTER — Non-Acute Institutional Stay: Payer: Medicare Other | Admitting: Primary Care

## 2019-04-24 DIAGNOSIS — Z515 Encounter for palliative care: Secondary | ICD-10-CM

## 2019-04-24 NOTE — Progress Notes (Signed)
Therapist, [facility] Consult Note Telephone: [phone]  Fax: [phone]([phone]  PATIENT NAME: Robin Archer DOB: November 07, 1945 MRN: [id number]  PRIMARY CARE PROVIDER:   Drue FlirtHenry-Bryson Palen, Carol, MD, [address] [place] [address] [phone]  REFERRING PROVIDER:  Drue FlirtHenry-Joseline Mccampbell, Carol, MD [address] [place],  [place] [address] [phone]  RESPONSIBLE PARTY:   Extended Emergency Contact Information Primary Emergency Contact: Stutts,[name] Address: [address]          [place], [place] [address] [place] Mobile Phone: [phone] Relation: Daughter Secondary Emergency Contact: Stephania FragminGILLEY,TERESA A Address: [address]          [place], [place] [address] Home Phone: [phone] Work Phone: [phone] Relation: None   ASSESSMENT AND RECOMMENDATIONS:   1. Advance Care Planning/Goals of Care: Goals include to maximize quality of life and symptom management.DNR, comfort measures, antibiotics and iV determine use, no feeding tube.   2. Symptom Management:   Constipation: Recommend adding Senna 1 tab daily, Continues to have constipation.  Pain: Patient denies pain and seems restful and comfortable.   Nutrition: Recommend weekly weights. Has been eating about 50%. Weights have been somewhat inconsistent so I'd like to assess weekly in order to ascertain nutritional status.  3. Family /Caregiver/Community Supports: Lives in LTC, daughters are poa and live in area.   4. Cognitive / Functional decline:  Appears to be at baseline. Pleasant and interactive.Must be assisted in all adls.  5. Follow up Palliative Care Visit: Palliative care will continue to follow for goals of care clarification and symptom management. Return 6 weeks or prn.  I spent 15 minutes providing this consultation,  from 1030 to 1045. More than 50% of the time in this consultation was spent coordinating communication.   HISTORY OF PRESENT ILLNESS:   Robin Archer is a 73 y.o. year old female with multiple medical problems including DM. MG, dementia, constipation, HTN. Palliative Care was asked to follow this patient by consultation request of Drue FlirtHenry-Kippy Gohman, Carol, MD to help address advance care planning and goals of care. This is a follow up visit.  CODE STATUS: DNR, comfort measures, antibiotics and iV determine use, no feeding tube.   PPS: 30% HOSPICE ELIGIBILITY/DIAGNOSIS: TBD  PAST MEDICAL HISTORY:  Past Medical History:  Diagnosis Date  . DM (diabetes mellitus) (HCC)   . GERD (gastroesophageal reflux disease)   . Hyperlipidemia   . Hypertension   . Memory loss   . Myasthenia gravis (HCC)   . Spinal stenosis   . Vitamin B deficiency     SOCIAL HX:  Social History   Tobacco Use  . Smoking status: Never Smoker  Substance Use Topics  . Alcohol use: No    Alcohol/week: 0.0 standard drinks    ALLERGIES: No Known Allergies   PERTINENT MEDICATIONS:  Outpatient Encounter Medications as of 04/24/2019  Medication Sig  . acetaminophen (TYLENOL) 325 MG tablet Take 650 mg by mouth every 6 (six) hours as needed.   . bisacodyl (DULCOLAX) 10 MG suppository Place 10 mg rectally 4 (four) times a week. Insert 2 suppositories at bedtime on Sunday, Monday, Wednesday, Friday.  . carvedilol (COREG) 12.5 MG tablet Take 12.5 mg by mouth 2 (two) times daily.  . cholecalciferol (VITAMIN D) 1000 UNITS tablet Take 1,000 Units by mouth daily.  . insulin glargine (LANTUS) 100 UNIT/ML injection Inject 30 Units into the skin 2 (two) times daily. Lantus 30 units qhs and Lantus 48 units each  morning.  . insulin regular (NOVOLIN R,HUMULIN R) 100 units/mL injection Inject 10 Units into the skin daily after lunch.  . lisinopril (PRINIVIL,ZESTRIL) 10 MG tablet Take 10 mg by mouth daily.  . magnesium oxide (MAG-OX) 400 MG tablet Take 800 mg by mouth 2 (two) times daily.  . ondansetron (ZOFRAN-ODT) 4 MG disintegrating tablet Take 4 mg by mouth every 8  (eight) hours as needed for nausea or vomiting.  . polyethylene glycol (MIRALAX / GLYCOLAX) packet Take 17 g by mouth 2 (two) times daily.  . protein supplement (RESOURCE BENEPROTEIN) POWD Take 1 scoop by mouth daily.  . sertraline (ZOLOFT) 50 MG tablet Take 50 mg by mouth daily.   No facility-administered encounter medications on file as of 04/24/2019.     PHYSICAL EXAM / ROS:   Current and past weights: Current 124 lbs, last reported weight was 104, so I'd like to recheck. Generally weights cluster between 128 and 124 lbs. General: NAD, frail appearing, thin Cardiovascular: no chest pain reported, no edema,  Pulmonary: no cough, no increased SOB Abdomen: appetite good, 60%, endorses occ constipation, incontinent of bowel GU: denies dysuria, incontinent of urine MSK:  no joint deformities, nonambulatory Skin: no rashes or wounds reported Neurological: Weakness, dementia  Jason Coop, NP  COVID-19 PATIENT SCREENING TOOL  Person answering questions: _________Staff__________ _____   1.  Is the patient or any family member in the home showing any signs or symptoms regarding respiratory infection?               Person with Symptom- __na_________________________  a. Fever                                                                          Yes___ No___          ___________________  b. Shortness of breath                                                    Yes___ No___          ___________________ c. Cough/congestion                                       Yes___  No___         ___________________ d. Body aches/pains                                                         Yes___ No___        ____________________ e. Gastrointestinal symptoms (diarrhea, nausea)           Yes___ No___        ____________________  2. Within the past 14 days, has anyone living in the home had any contact with someone with or under investigation for COVID-19?    Yes___ No_x_  Person  __________________

## 2019-05-27 ENCOUNTER — Other Ambulatory Visit: Payer: Self-pay

## 2019-05-27 ENCOUNTER — Non-Acute Institutional Stay: Payer: Medicare Other | Admitting: Primary Care

## 2019-05-27 DIAGNOSIS — Z515 Encounter for palliative care: Secondary | ICD-10-CM

## 2019-05-27 NOTE — Progress Notes (Signed)
Designer, jewellery Palliative Care Consult Note Telephone: [phone]  Fax: [phone]  TELEHEALTH VISIT STATEMENT Due to the COVID-19 crisis, this visit was done via telemedicine from my office. It was initiated and consented to by this patient and/or family.  PATIENT NAME: Robin Archer DOB: 23-Nov-1945 MRN: [id number]  PRIMARY CARE PROVIDER:   Marisa Hua, MD, [place] [address] [phone]  REFERRING PROVIDER:  Marisa Hua, MD [address] [place],  [place] [address] [phone]  RESPONSIBLE PARTY:   Extended Emergency Contact Information Primary Emergency Contact: Robin Archer Address: [place], [place] [address] Robin Archer of [place] Mobile Phone: [phone] Relation: Daughter Secondary Emergency Contact: Robin Archer Address: [address]  [place], [place] [address] Home Phone: [phone] Work Phone: [phone] Relation: None    ASSESSMENT AND RECOMMENDATIONS:   1. Advance Care Planning/Goals of Care: Goals include to maximize quality of life and symptom management. DNR, comfort measures, antibiotics and iV determine use, no feeding tube currently on Archer. Attempted to reach Robin Archer, POA, no answer, no message able to be left.Attempted to reach Robin Archer, [place], no answer, message left.   2. Symptom Management:    Constipation: None today, continue with bowel protocol.   Pain: Denies, appears restful and relaxed.   Nutrition: Has had a slight gain, recommend weekly weights and supplemental nutrition.  3. Family /Caregiver/Community Supports: Daughters are Robin Archer, lives in [place].  4. Cognitive / Functional decline: Appears at baseline per staff and this writer's assessment. Needs help with all alds.  5. Follow up Palliative Care Visit: Palliative care will continue to follow for goals of care clarification and symptom management. Return 6 weeks or prn.   I spent 25 minutes providing this consultation,  from 0900 to 0925. More than 50% of the time in this consultation was spent coordinating communication.   HISTORY OF PRESENT ILLNESS:  Robin Archer is a 73 y.o. year old female with multiple medical problems including DM. MG, dementia, constipation, HTN.. Palliative Care was asked to follow this patient by consultation request of Robin Hua, MD to help address advance care planning and goals of care. This is a follow up visit.  CODE STATUS: DNR, comfort measures, antibiotics and iV determine use, no feeding tube.  PPS: 30% HOSPICE ELIGIBILITY/DIAGNOSIS: TBD  PAST MEDICAL HISTORY:  Past Medical History:  Diagnosis Date  . DM (diabetes mellitus) ([facility])   . GERD (gastroesophageal reflux disease)   . Hyperlipidemia   . Hypertension   . Memory loss   . Myasthenia gravis ([facility])   . Spinal stenosis   . Vitamin B deficiency     SOCIAL HX:  Social History   Tobacco Use  . Smoking status: Never Smoker  Substance Use Topics  . Alcohol use: No    Alcohol/week: 0.0 standard drinks    ALLERGIES: No Known Allergies   PERTINENT MEDICATIONS:  Outpatient Encounter Medications as of 05/27/2019  Medication Sig  . acetaminophen (TYLENOL) 325 MG tablet Take 650 mg by mouth every 6 (six) hours as needed.   . bisacodyl (DULCOLAX) 10 MG suppository Place 10 mg rectally 4 (four) times a week. Insert 2 suppositories at bedtime on Sunday, Monday, Wednesday, Friday.  . carvedilol (COREG) 12.5 MG tablet Take 12.5 mg by mouth 2 (two) times daily.  . cholecalciferol (VITAMIN D) 1000 UNITS tablet Take  1,000 Units by mouth daily.  . insulin glargine (LANTUS) 100 UNIT/ML injection Inject 30 Units into the skin 2 (two) times daily. Lantus 30 units qhs and Lantus 48 units each morning.  . insulin regular (NOVOLIN R,HUMULIN R) 100 units/mL injection Inject 10 Units into the skin daily after lunch.  . lisinopril (PRINIVIL,ZESTRIL) 10 MG tablet Take 10  mg by mouth daily.  . magnesium oxide (MAG-OX) 400 MG tablet Take 800 mg by mouth 2 (two) times daily.  . ondansetron (ZOFRAN-ODT) 4 MG disintegrating tablet Take 4 mg by mouth every 8 (eight) hours as needed for nausea or vomiting.  . polyethylene glycol (MIRALAX / GLYCOLAX) packet Take 17 g by mouth 2 (two) times daily.  . protein supplement (RESOURCE BENEPROTEIN) POWD Take 1 scoop by mouth daily.  . sertraline (ZOLOFT) 50 MG tablet Take 50 mg by mouth daily.   No facility-administered encounter medications on Archer as of 05/27/2019.     PHYSICAL EXAM / ROS:   Current and past weights: 124. 5 lbs 9/3, last weight was 124. General: NAD, frail appearing, thin, smiling but non verbal Cardiovascular: no chest pain reported, no edema reported Pulmonary: no cough, no increased SOB Abdomen: appetite fair, denies constipation, incontinent of bowel GU: denies dysuria, incontinent of urine MSK:  no joint deformities, non ambulatory Skin: no rashes or wounds reported Neurological: Weakness, smiled but did not talk. OOB in chair some days.  Robin FileKathryn M Yanna Leaks DNP AGPCNP-BC

## 2019-05-29 ENCOUNTER — Non-Acute Institutional Stay: Payer: Medicare Other | Admitting: Primary Care

## 2019-07-31 ENCOUNTER — Non-Acute Institutional Stay: Payer: Medicare Other | Admitting: Primary Care

## 2019-07-31 ENCOUNTER — Other Ambulatory Visit: Payer: Self-pay

## 2019-07-31 DIAGNOSIS — Z515 Encounter for palliative care: Secondary | ICD-10-CM

## 2019-07-31 NOTE — Progress Notes (Signed)
Designer, [facility]ell[facility] Palliative Care Consult Note Telephone: [phone]  Fax: [phone]  TELEHEALTH VISIT STATEMENT Due to the COVID-19 crisis, this visit was done via telemedicine from my office. It was initiated and consented to by this patient and/or family.  PATIENT NAME: Robin Archer [place] [address] [phone] (home) [phone] (work) DOB: Apr 01, 1946 MRN: [id number]  PRIMARY CARE PROVIDER:   Marisa Hua, MD, [place] [address] [phone]  REFERRING PROVIDER:  Marisa Hua, MD [address] [place],  [place] [address] [phone]  RESPONSIBLE PARTY:   Extended Emergency Contact Information Primary Emergency Contact: Stutts,[name] Address: [place], [place] [address] [name] of [place] Mobile Phone: [phone] Relation: Daughter Secondary Emergency Contact: Debbrah Alar Address: [address]  [place], [place] [address] Home Phone: [phone] Work Phone: [phone] Relation: None  ASSESSMENT AND RECOMMENDATIONS:   1. Advance Care Planning/Goals of Care: Goals include to maximize quality of life and symptom management. DNR on record.  2. Symptom Management:   I met with Robin Archer  by telemedicine with the assistance of RN Freada Bergeron.  Pain:  Needs PRN analgesia, not on record. She appeared very comfortable, well positioned in her bed. She was smiling and looking at [place] but did not speak. She did respond to a caregiver who asked her if she was in pain and she said no. We did not assess her to be in pain as she looked very relaxed and smiling. Appears resolved from shoulder injury.  Contracture:  Recommend PT assessment if this is new. Daughter would like prn analgesia option such as acetaminophen 650 mg tid prn. Daughter is concerned about a leg contracture. We discussed the nature of contracture and chronic neurological disease in  that it is not a willful or or voluntary condition. I told her of surgeries that could release contractures if they were indicated. She may benefit from a another dose of scheduled pain medicine if her leg continues to be for her leg pain. This is a chronic condition.   3. Family /Caregiver/Community Supports: Spoke with POA [name]. She sees her via window visits.  Patient lives in [place] home.  4. Cognitive / Functional decline: Alert, non verbal in interview. Dependent in all adls and iadls.   5. Follow up Palliative Care Visit: Palliative care will continue to follow for goals of care clarification and symptom management. Return 6 weeks or prn.  I spent 25 minutes providing this consultation,  from 1030 to 1055. More than 50% of the time in this consultation was spent coordinating communication.   HISTORY OF PRESENT ILLNESS:  Robin Archer is a 73 y.o. year old female with multiple medical problems including MG, DM, memory loss, debility. Palliative Care was asked to follow this patient by consultation request of Marisa Hua, MD to help address advance care planning and goals of care. This is a follow up visit.  CODE STATUS: DNR  PPS: 30% HOSPICE ELIGIBILITY/DIAGNOSIS: TBD  PAST MEDICAL HISTORY:  Past Medical History:  Diagnosis Date   DM (diabetes mellitus) ([facility])    GERD (gastroesophageal reflux disease)    Hyperlipidemia    Hypertension    Memory loss    Myasthenia gravis ([facility])    Spinal stenosis    Vitamin B deficiency     SOCIAL HX:  Social History  Tobacco Use   Smoking status: Never Smoker  Substance Use Topics   Alcohol use: No    Alcohol/week: 0.0 standard drinks    ALLERGIES: No Known Allergies   PERTINENT MEDICATIONS:  Outpatient Encounter Medications as of 07/31/2019  Medication Sig   bisacodyl (DULCOLAX) 10 MG suppository Place 10 mg rectally 4 (four) times a week. Insert 2 suppositories at bedtime on Sunday, Monday, Wednesday, Friday.    ferrous sulfate 220 (44 Fe) MG/5ML solution Take 220 mg by mouth daily.   insulin glargine (LANTUS) 100 UNIT/ML injection Inject 20 Units into the skin every morning. And take 40 units a bed time   insulin regular (NOVOLIN R,HUMULIN R) 100 units/mL injection Inject 10 Units into the skin daily after lunch.   metoprolol tartrate (LOPRESSOR) 50 MG tablet Take 50 mg by mouth 2 (two) times daily.   ondansetron (ZOFRAN-ODT) 4 MG disintegrating tablet Take 4 mg by mouth every 8 (eight) hours as needed for nausea or vomiting.   pantoprazole (PROTONIX) 40 MG tablet Take 40 mg by mouth daily.   polyethylene glycol (MIRALAX / GLYCOLAX) packet Take 17 g by mouth 2 (two) times daily.   acetaminophen (TYLENOL) 325 MG tablet Take 650 mg by mouth every 6 (six) hours as needed.    carvedilol (COREG) 12.5 MG tablet Take 12.5 mg by mouth 2 (two) times daily.   cholecalciferol (VITAMIN D) 1000 UNITS tablet Take 1,000 Units by mouth daily.   lisinopril (PRINIVIL,ZESTRIL) 10 MG tablet Take 10 mg by mouth daily.   magnesium oxide (MAG-OX) 400 MG tablet Take 800 mg by mouth 2 (two) times daily.   protein supplement (RESOURCE BENEPROTEIN) POWD Take 1 scoop by mouth daily.   sertraline (ZOLOFT) 50 MG tablet Take 50 mg by mouth daily.   No facility-administered encounter medications on file as of 07/31/2019.      PHYSICAL EXAM / ROS:   Current and past weights: 123 lbs, stable since summer General: NAD, frail appearing, thin. BG range form 94 to 450, poor control Cardiovascular: no chest pain reported, no edema reported Pulmonary: no cough, no increased SOB, room air Abdomen: appetite good, 75% , endorses occ constipation, incontinent of bowel GU: denies dysuria, incontinent of urine MSK: leg contracture, non ambulatory, left shoulder injury appears resolved Skin: no rashes or wounds reported Neurological: Weakness, interactive in person with staff  Jason Coop, NP

## 2019-09-18 ENCOUNTER — Non-Acute Institutional Stay: Payer: Medicare Other | Admitting: Primary Care

## 2019-09-18 ENCOUNTER — Other Ambulatory Visit: Payer: Self-pay

## 2019-09-19 ENCOUNTER — Non-Acute Institutional Stay: Payer: Medicare Other | Admitting: Primary Care

## 2019-09-19 ENCOUNTER — Other Ambulatory Visit: Payer: Self-pay

## 2019-09-19 DIAGNOSIS — Z515 Encounter for palliative care: Secondary | ICD-10-CM

## 2019-09-19 NOTE — Progress Notes (Signed)
Designer, [facility]ellery Palliative Care Consult Note Telephone: [phone]  Fax: [phone]  TELEHEALTH VISIT STATEMENT Due to the COVID-19 crisis, this visit was done via telemedicine from my office. It was initiated and consented to by this patient and/or family. I met with Robin Archer  by telemedicine with the assistance of RN Robin Archer.   PATIENT NAME: Robin Archer [id number] [phone] (home) [phone] (work) DOB: 12/09/1945 MRN: [id number]  PRIMARY CARE PROVIDER:   Marisa Hua, MD, Robin Archer [address] [phone]  REFERRING PROVIDER:  Marisa Hua, MD [address] [place],  [place] [address] [phone]  RESPONSIBLE PARTY:   Extended Emergency Contact Information Primary Emergency Contact: Robin Archer Address: [place], [place] [address] Robin Archer of [place] Mobile Phone: [phone] Relation: Daughter Secondary Emergency Contact: Robin Archer Address: [address]  [place], [place] [address] Home Phone: [phone] Work Phone: [phone] Relation: None   ASSESSMENT AND RECOMMENDATIONS:   1. Advance Care Planning/Goals of Care: Goals include to maximize quality of life and symptom management. DNR on record. Patient POA called, Ms. Robin Archer. No answer, message left.   2. Symptom Management:   Pain Control: Appear comfortable in bed. Denies pain with head shake  Nutrition; Wt stable, eating 50%. Staff states refusing meds more.This is subjective decline over past month. Continue to monitor weights. Glucose is in poor control with most sugars in 200-300s and range 90-450.  Dyspnea: Appears comfortable on room air. Was Rx for pneumonia in early Dec.  3. Family /Caregiver/Community Supports: Daughters are Robin Archer. Lives in [place].  4. Cognitive / Functional decline: Alert, non verbal in interview. Dependent in all adls and iadls.    5. Follow up Palliative Care Visit: Palliative care will continue to follow for goals of care clarification and symptom management. Return 6 weeks or prn.  I spent 25 minutes providing this consultation,  from 13090 to 1325. More than 50% of the time in this consultation was spent coordinating communication.   HISTORY OF PRESENT ILLNESS:  Robin Archer is a 74 y.o. year old female with multiple medical problems including MG, DM, memory loss, debility. Palliative Care was asked to follow this patient by consultation request of Robin Hua, MD to help address advance care planning and goals of care. This is a follow up visit.  CODE STATUS: DNR  PPS: 30% HOSPICE ELIGIBILITY/DIAGNOSIS: TBD  PAST MEDICAL HISTORY:  Past Medical History:  Diagnosis Date  . DM (diabetes mellitus) ([facility])   . GERD (gastroesophageal reflux disease)   . Hyperlipidemia   . Hypertension   . Memory loss   . Myasthenia gravis ([facility])   . Spinal stenosis   . Vitamin B deficiency     SOCIAL HX:  Social History   Tobacco Use  . Smoking status: Never Smoker  Substance Use Topics  . Alcohol use: No    Alcohol/week: 0.0 standard drinks    ALLERGIES: No Known Allergies   PERTINENT MEDICATIONS:  Outpatient Encounter Medications as of 09/19/2019  Medication Sig  . acetaminophen (TYLENOL) 325 MG tablet Take 650 mg by mouth every 6 (six) hours as needed.   . bisacodyl (DULCOLAX) 10 MG suppository Place 10 mg rectally 4 (four) times a week. Insert 2 suppositories at bedtime on Sunday, Monday, Wednesday, Friday.  . carvedilol (COREG) 12.5 MG tablet  Take 12.5 mg by mouth 2 (two) times daily.  . cholecalciferol (VITAMIN D) 1000 UNITS tablet Take 1,000 Units by mouth daily.  . ferrous sulfate 220 (44 Fe) MG/5ML solution Take 220 mg by mouth daily.  . insulin glargine (LANTUS) 100 UNIT/ML injection Inject 20 Units into the skin every morning. And take 40 units a bed time  . insulin regular (NOVOLIN R,HUMULIN R)  100 units/mL injection Inject 10 Units into the skin daily after lunch.  . lisinopril (PRINIVIL,ZESTRIL) 10 MG tablet Take 10 mg by mouth daily.  . magnesium oxide (MAG-OX) 400 MG tablet Take 800 mg by mouth 2 (two) times daily.  . metoprolol tartrate (LOPRESSOR) 50 MG tablet Take 50 mg by mouth 2 (two) times daily.  . ondansetron (ZOFRAN-ODT) 4 MG disintegrating tablet Take 4 mg by mouth every 8 (eight) hours as needed for nausea or vomiting.  . pantoprazole (PROTONIX) 40 MG tablet Take 40 mg by mouth daily.  . polyethylene glycol (MIRALAX / GLYCOLAX) packet Take 17 g by mouth 2 (two) times daily.  . protein supplement (RESOURCE BENEPROTEIN) POWD Take 1 scoop by mouth daily.  . sertraline (ZOLOFT) 50 MG tablet Take 50 mg by mouth daily.   No facility-administered encounter medications on file as of 09/19/2019.    PHYSICAL EXAM / ROS:   Current and past weights: 123 lb, stable General: NAD, frail appearing, thin Cardiovascular: no chest pain reported, no edema Pulmonary: no cough, no increased SOB, room air Abdomen: appetite fair, endorses occ  constipation, incontinent of bowel GU: denies dysuria, incontinent of urine MSK:  no joint deformities, non ambulatory Skin: no rashes or wounds reported Neurological: Weakness, MG, non verbal  Robin Coop, NP, [facility]

## 2019-10-30 ENCOUNTER — Other Ambulatory Visit: Payer: Self-pay

## 2019-10-30 ENCOUNTER — Non-Acute Institutional Stay: Payer: Medicare Other | Admitting: Primary Care

## 2019-10-30 DIAGNOSIS — Z515 Encounter for palliative care: Secondary | ICD-10-CM

## 2019-10-30 NOTE — Progress Notes (Signed)
Therapist, nutritional Palliative Care Consult Note Telephone: [phone]  Fax: [phone]  TELEHEALTH VISIT STATEMENT Due to the COVID-19 crisis, this visit was done via telemedicine from my office. It was initiated and consented to by this patient and/or family.  PATIENT NAME: Robin Archer [address] [place] [address] [phone] (home) [phone] (work) DOB: Jul 02, 1946 MRN: [id number]  PRIMARY CARE PROVIDER:   Drue Flirt, MD, [address] [place] [address] [phone]  REFERRING PROVIDER:  Drue Flirt, MD [address] [place],  [place] [address] [phone]  RESPONSIBLE PARTY:   Extended Emergency Contact Information Primary Emergency Contact: Archer,Robin Address: [address]          [place], [place] [address] Darden Amber of [place] Mobile Phone: [phone] Relation: Daughter Secondary Emergency Contact: Robin Archer Address: [address]  [place], [place] [address] Home Phone: [phone] Work Phone: [phone] Relation: None   ASSESSMENT AND RECOMMENDATIONS:   1. Advance Care Planning/Goals of Care: Goals include to maximize quality of life and symptom management. DNR on file.  2. Symptom Management:   Nutrition: Intake declining, weights continue to decline, now 121 lbs. 3 lb weight loss in 2 months. Recommend returning to SSRI for appetite improvement, or mirtazapine 7.5 mg.  3. Family /Caregiver/Community Supports: Daughters are [employer]. Lives in LTC.  4. Cognitive / Functional decline: Alert, non verbal per report. Dependent in all adls and iadls.  5. Follow up Palliative Care Visit: Palliative care will continue to follow for goals of care clarification and symptom management. Return 6 weeks or prn.  I spent 15 minutes providing this consultation,  from 1030 to 1045. More than 50% of the time in this consultation was spent coordinating communication.   HISTORY OF PRESENT  ILLNESS:  Robin Archer is a 74 y.o. year old female with multiple medical problems including MG, labile DM, memory loss, debility. Palliative Care was asked to follow this patient by consultation request of Robin Flirt, MD to help address advance care planning and goals of care. This is a follow up visit.  CODE STATUS: DNR  PPS: 30% HOSPICE ELIGIBILITY/DIAGNOSIS: TBD  PAST MEDICAL HISTORY:  Past Medical History:  Diagnosis Date  . DM (diabetes mellitus) ([facility])   . GERD (gastroesophageal reflux disease)   . Hyperlipidemia   . Hypertension   . Memory loss   . Myasthenia gravis ([facility])   . Spinal stenosis   . Vitamin B deficiency     SOCIAL HX:  Social History   Tobacco Use  . Smoking status: Never Smoker  Substance Use Topics  . Alcohol use: No    Alcohol/week: 0.0 standard drinks    ALLERGIES: No Known Allergies   PERTINENT MEDICATIONS:  Outpatient Encounter Medications as of 10/30/2019  Medication Sig  . bisacodyl (DULCOLAX) 10 MG suppository Place 10 mg rectally 4 (four) times a week. Insert 2 suppositories at bedtime on Sunday, Monday, Wednesday, Friday.  . ferrous sulfate 220 (44 Fe) MG/5ML solution Take 220 mg by mouth daily.  . insulin glargine (LANTUS) 100 UNIT/ML injection Inject 20 Units into the skin every morning. And take 40 units a bed time  . insulin regular (NOVOLIN R,HUMULIN R) 100 units/mL injection Inject 10 Units into the skin daily after lunch.  . metoprolol tartrate (LOPRESSOR) 50 MG tablet Take 50 mg by mouth 2 (two) times daily.  . pantoprazole (PROTONIX) 40 MG tablet Take 40  mg by mouth daily.  . polyethylene glycol (MIRALAX / GLYCOLAX) packet Take 17 g by mouth 2 (two) times daily.  . [DISCONTINUED] acetaminophen (TYLENOL) 325 MG tablet Take 650 mg by mouth every 6 (six) hours as needed.   . [DISCONTINUED] carvedilol (COREG) 12.5 MG tablet Take 12.5 mg by mouth 2 (two) times daily.  . [DISCONTINUED] cholecalciferol (VITAMIN D) 1000 UNITS tablet  Take 1,000 Units by mouth daily.  . [DISCONTINUED] lisinopril (PRINIVIL,ZESTRIL) 10 MG tablet Take 10 mg by mouth daily.  . [DISCONTINUED] magnesium oxide (MAG-OX) 400 MG tablet Take 800 mg by mouth 2 (two) times daily.  . [DISCONTINUED] ondansetron (ZOFRAN-ODT) 4 MG disintegrating tablet Take 4 mg by mouth every 8 (eight) hours as needed for nausea or vomiting.  . [DISCONTINUED] protein supplement (RESOURCE BENEPROTEIN) POWD Take 1 scoop by mouth daily.  . [DISCONTINUED] sertraline (ZOLOFT) 50 MG tablet Take 50 mg by mouth daily.   No facility-administered encounter medications on file as of 10/30/2019.    PHYSICAL EXAM / ROS:   97.5-60-18  118/72 Current and past weights: 121lbs. BG labile from 500's -64 General: NAD, frail appearing, thin Cardiovascular: no chest pain reported, no edema reported Pulmonary: no cough, no increased SOB Abdomen: appetite fair, denies constipation, incontinent of bowel GU: denies dysuria, incontinent of urine MSK:  no joint deformities, non ambulatory Skin: no rashes or wounds reported Neurological: Weakness, non verbal  Robin Coop, NP

## 2019-12-26 ENCOUNTER — Other Ambulatory Visit: Payer: Self-pay

## 2019-12-26 ENCOUNTER — Non-Acute Institutional Stay: Payer: Medicare Other | Admitting: Primary Care

## 2019-12-26 DIAGNOSIS — Z515 Encounter for palliative care: Secondary | ICD-10-CM

## 2019-12-26 NOTE — Progress Notes (Signed)
[facility] Consult Note Telephone: [phone]  Fax: [phone]    PATIENT NAME: Robin [place] [address] [phone] (home) [phone] (work) DOB: 23-Sep-1945 MRN: [id number]  PRIMARY CARE PROVIDER:   Marisa Hua, MD, [place] [address] [phone]  REFERRING PROVIDER:  Marisa Hua, MD [address] [place],  [place] [address] [phone]  RESPONSIBLE PARTY:   Extended Emergency Contact Information Primary Emergency Contact: Stutts,[name] Address: [place]          [place], [place] [address] [name] of [place] Mobile Phone: [phone] Relation: Daughter Secondary Emergency Contact: Debbrah Alar Address: [address]  [place], [place] [address] Home Phone: [phone] Work Phone: [phone] Relation: None   I met with patient at the  facility.  ASSESSMENT AND RECOMMENDATIONS:   1. Advance Care Planning/Goals of Care: Goals include to maximize quality of life and symptom management. T/c to Larey Seat who states patient was talking yesterday. Today she was making nods/shakes head.They would like to visit her soon. States her birthday is coming up and maybe they could take something to her. Encouraged daughter who has missed in person visits.  2. Symptom Management:  Discussed overall wellbeing with POA, patient seems to be feeling well today, sitting in chair, smiling. Today she answers me with yes/ no head shakes. She has zofran recently ordered. She has begun to gain some weight back after a 10-12 lb loss over the winter. BG have been well controlled.  3. Family /Caregiver/Community Supports: Daughters are PR, lives in [place].  4. Cognitive / Functional decline: Alert oriented x 1. Able to shake head yes/no. In w/c. Needs assistance with all adls, iadls.   5. Follow up Palliative Care Visit: Palliative care will continue to  follow for goals of care clarification and symptom management. Return 4-6 weeks or prn.  I spent 25 minutes providing this consultation,  from 1030 to 1055. More than 50% of the time in this consultation was spent coordinating communication.   HISTORY OF PRESENT ILLNESS:  Rahma Meller is a 74 y.o. year old female with multiple medical problems including MG, labile DM, memory loss, debility.. Palliative Care was asked to follow this patient by consultation request of Marisa Hua, MD to help address advance care planning and goals of care. This is a follow up visit.  CODE STATUS: DNR PPS: 30% HOSPICE ELIGIBILITY/DIAGNOSIS: TBD  PAST MEDICAL HISTORY:  Past Medical History:  Diagnosis Date  . DM (diabetes mellitus) ([place])   . GERD (gastroesophageal reflux disease)   . Hyperlipidemia   . Hypertension   . Memory loss   . Myasthenia gravis ([place])   . Spinal stenosis   . Vitamin B deficiency     SOCIAL HX:  Social History   Tobacco Use  . Smoking status: Never Smoker  Substance Use Topics  . Alcohol use: No    Alcohol/week: 0.0 standard drinks    ALLERGIES: No Known Allergies   PERTINENT MEDICATIONS:  Outpatient Encounter Medications as of 12/26/2019  Medication Sig  . bisacodyl (DULCOLAX) 10 MG suppository Place 10 mg rectally 4 (four) times a week. Insert 2 suppositories at bedtime on Sunday, Monday, Wednesday, Friday.  . ferrous sulfate 220 (44 Fe) MG/5ML solution Take 220 mg by mouth daily.  . hydroxypropyl methylcellulose / hypromellose (ISOPTO TEARS / GONIOVISC) 2.5 % ophthalmic solution Place 2 drops  into both eyes 4 (four) times daily.  . insulin glargine (LANTUS) 100 UNIT/ML injection Inject 18 Units into the skin every morning. And take 40 units a bed time  . insulin regular (NOVOLIN R,HUMULIN R) 100 units/mL injection Inject 10 Units into the skin 2 (two) times daily as needed. For bg > 350  . metoprolol tartrate (LOPRESSOR) 50 MG tablet Take 50 mg by mouth 2 (two)  times daily.  . ondansetron (ZOFRAN) 4 MG tablet Take 4 mg by mouth every 6 (six) hours as needed for nausea or vomiting.  . pantoprazole (PROTONIX) 40 MG tablet Take 40 mg by mouth daily.  . polyethylene glycol (MIRALAX / GLYCOLAX) packet Take 17 g by mouth 2 (two) times daily.   No facility-administered encounter medications on file as of 12/26/2019.     PHYSICAL EXAM / ROS:   Current and past weights: 113 lbs General: NAD, frail appearing, thin Cardiovascular: no chest pain reported, no edema in LE  Pulmonary: no cough, no increased SOB, room air Abdomen: appetite fair, 50% in take, endorses occ  constipation, incontinent of bowel GU: denies dysuria, incontinent of urine MSK:  no joint deformities, non-ambulatory , chair bound Skin: no rashes or wounds reported Neurological: Weakness, MG, non verbal but nods head, smiling  Jason Coop, NP [facility]  COVID-19 PATIENT SCREENING TOOL  Person answering questions: ____________Staff_______ _____   1.  Is the patient or any family member in the home showing any signs or symptoms regarding respiratory infection?               Person with Symptom- __________NA_________________  a. Fever                                                                          Yes___ No___          ___________________  b. Shortness of breath                                                    Yes___ No___          ___________________ c. Cough/congestion                                       Yes___  No___         ___________________ d. Body aches/pains                                                         Yes___ No___        ____________________ e. Gastrointestinal symptoms (diarrhea, nausea)           Yes___ No___        ____________________  2. Within the past 14 days, has anyone living in the home had any contact with someone with or under investigation  for COVID-19?    Yes___ No_X_   Person __________________

## 2020-02-06 ENCOUNTER — Non-Acute Institutional Stay: Payer: Medicare Other | Admitting: Primary Care

## 2020-02-06 ENCOUNTER — Other Ambulatory Visit: Payer: Self-pay

## 2020-02-06 DIAGNOSIS — Z515 Encounter for palliative care: Secondary | ICD-10-CM

## 2020-02-06 NOTE — Progress Notes (Signed)
[facility] Consult Note Telephone: [phone]  Fax: [phone]  PATIENT NAME: Robin Archer [id number] [phone] (home) [phone] (work) DOB: 18-May-1946 MRN: [id number]  PRIMARY CARE PROVIDER:    Marisa Hua, MD,  [place] [address] [phone]  REFERRING PROVIDER:   Marisa Hua, MD [address] [place],  [place] [address] [phone]  RESPONSIBLE PARTY:   Extended Emergency Contact Information Primary Emergency Contact: Stutts,[name] Address: [place]          [place], [place] [address] [name] of [place] Mobile Phone: [phone] Relation: Daughter Secondary Emergency Contact: Debbrah Alar Address: [address]  [place], [place] [address] Home Phone: [phone] Work Phone: [phone] Relation: None    I met with patient in the  facility.  ASSESSMENT AND RECOMMENDATIONS:   1. Advance Care Planning/Goals of Care: Goals include to maximize quality of life and symptom management. DNR on file.  2. Symptom Management:   I met with Robin Archer in her nursing home room. She was in her bed. She is nonverbal but able to shake her head yes and no appropriately. Staff mentioned that her daughter had been there yesterday and brought lunch. She concurred this. She stated today that she did not have pain but that she was a little sad. She needed something, not repositioning or pain but eventually I discovered  it was a drink. I helped her with this and would recommend staff off her water throughout the day.  3. Family /Caregiver/Community Supports: Daughters are PR, lives in [place].  4. Cognitive / Functional decline: Alert oriented x 1-2. Able to shake head yes/no. In w/c. Needs assistance with all adls, iadls.   5. Follow up Palliative Care Visit: Palliative care will continue to follow for goals of care clarification and symptom  management. Return 8 weeks or prn.  I spent 25 minutes providing this consultation,  from 1130 to 1155. More than 50% of the time in this consultation was spent coordinating communication.   HISTORY OF PRESENT ILLNESS:  Robin Archer is a 74 y.o. year old female with multiple medical problems including MG,labileDM, memory loss, debility. Palliative Care was asked to follow this patient by consultation request of Marisa Hua, MD to help address advance care planning and goals of care. This is a follow up visit.  CODE STATUS: DNR  PPS: 30% HOSPICE ELIGIBILITY/DIAGNOSIS: TBD  PAST MEDICAL HISTORY:  Past Medical History:  Diagnosis Date  . DM (diabetes mellitus) ([place])   . GERD (gastroesophageal reflux disease)   . Hyperlipidemia   . Hypertension   . Memory loss   . Myasthenia gravis ([facility])   . Spinal stenosis   . Vitamin B deficiency     SOCIAL HX:  Social History   Tobacco Use  . Smoking status: Never Smoker  Substance Use Topics  . Alcohol use: No    Alcohol/week: 0.0 standard drinks    ALLERGIES: No Known Allergies   PERTINENT MEDICATIONS:  Outpatient Encounter Medications as of 02/06/2020  Medication Sig  . atorvastatin (LIPITOR) 10 MG tablet Take 10 mg by mouth daily.  . bisacodyl (DULCOLAX) 10 MG suppository Place 10 mg rectally 4 (four) times a week. Insert 1 pr at bedtime on Sunday, Monday, Wednesday, Friday.  . ferrous sulfate 220 (44 Fe) MG/5ML solution Take 220 mg by mouth daily.  . hydroxypropyl methylcellulose /  hypromellose (ISOPTO TEARS / GONIOVISC) 2.5 % ophthalmic solution Place 2 drops into both eyes 4 (four) times daily.  . insulin glargine (LANTUS) 100 UNIT/ML injection Inject 18 Units into the skin every morning. And take 40 units a bed time  . insulin regular (NOVOLIN R,HUMULIN R) 100 units/mL injection Inject 10 Units into the skin 2 (two) times daily as needed. For bg > 350  . metoprolol tartrate (LOPRESSOR) 50 MG tablet Take 50 mg by mouth 2  (two) times daily.  . Omega-3 Fatty Acids (FISH OIL) 1000 MG CAPS Take 1 capsule by mouth daily.  . ondansetron (ZOFRAN) 4 MG tablet Take 4 mg by mouth every 6 (six) hours as needed for nausea or vomiting.  . pantoprazole (PROTONIX) 40 MG tablet Take 40 mg by mouth daily.  . polyethylene glycol (MIRALAX / GLYCOLAX) packet Take 17 g by mouth 2 (two) times daily.   No facility-administered encounter medications on file as of 02/06/2020.     PHYSICAL EXAM / ROS:   Current and past weights: 112 lbs, slight loss. General: NAD, frail appearing, thin Cardiovascular: no chest pain reported, no edema  In LE Pulmonary: no cough, no increased SOB, room air Abdomen: appetite fair, denies  constipation, incontinent of bowel GU: denies dysuria, incontinent of urine MSK:  + contracture/ joint and ROM abnormalities, non ambulatory Skin: no rashes or wounds reported Neurological: Weakness, MG, unable to speak  Jason Coop, NP [facility]  COVID-19 PATIENT SCREENING TOOL  Person answering questions: ____________Staff_______ _____   1.  Is the patient or any family member in the home showing any signs or symptoms regarding respiratory infection?               Person with Symptom- __________NA_________________  a. Fever                                                                          Yes___ No___          ___________________  b. Shortness of breath                                                    Yes___ No___          ___________________ c. Cough/congestion                                       Yes___  No___         ___________________ d. Body aches/pains                                                         Yes___ No___        ____________________ e. Gastrointestinal symptoms (diarrhea, nausea)           Yes___ No___  ____________________  2. Within the past 14 days, has anyone living in the home had any contact with someone with or under investigation for COVID-19?      Yes___ No_X_   Person __________________

## 2020-03-26 ENCOUNTER — Other Ambulatory Visit: Payer: Self-pay

## 2020-03-26 ENCOUNTER — Non-Acute Institutional Stay: Payer: Medicare Other | Admitting: Primary Care

## 2020-03-26 DIAGNOSIS — G7000 Myasthenia gravis without (acute) exacerbation: Secondary | ICD-10-CM

## 2020-03-26 DIAGNOSIS — Z515 Encounter for palliative care: Secondary | ICD-10-CM

## 2020-03-26 NOTE — Progress Notes (Signed)
[facility] Consult Note Telephone: [phone]  Fax: [phone]  PATIENT NAME: Robin Archer [place] [address] [phone] (home) [phone] (work) DOB: [birth year] MRN: [id number]  PRIMARY CARE PROVIDER:    Marisa Hua, MD,  [place] [address] [phone]  REFERRING PROVIDER:   Marisa Hua, MD [address] [place],  [place] [address] [phone]  RESPONSIBLE PARTY:   Extended Emergency Contact Information Primary Emergency Contact: Archer,Robin Address: [place]          [place], [place] [address] [name] of [place] Mobile Phone: [phone] Relation: Daughter Secondary Emergency Contact: Debbrah Alar Address: [address]  [place],  [address] Home Phone: [phone] Work Phone: [phone] Relation: None  I met with patient in the facility.  ASSESSMENT AND RECOMMENDATIONS:   1. Advance Care Planning/Goals of Care: Goals include to maximize quality of life and symptom management. T/c to POA, first to Robin, no ability to leave a voice mail. T/c to [name], message left to return call, if desired.  2. Symptom Management:   Patient denies pain. She can nod yes /no but does not speak much. Staff reports she is refusing a lot of her meds. She does accept glucose monitoring and insulin.   Immobility: She is in a chair today and has trouble holding up her head. I would recommend a round neck pillow  For support while she is up in a sitting position.  Glucose control: Review of glucose record shows her blood sugars are high in the evening (high 200-300) but good fasting. Recommend increasing am levemir to 20 units [place]. She has had 3 lb wt loss in 3 months. I will monitor for trend.   Constipation: Frequently has small bms,  Recommend adding senna 1 tab bid daily for more frequent bms. This may also help intake.  3. Family  /Caregiver/Community Supports: Daughters are pr/poa. Lives in [place]  4. Cognitive / Functional decline: A and O x 1-2. Unable to perform any adls or iadls.  5. Follow up Palliative Care Visit: Palliative care will continue to follow for goals of care clarification and symptom management. Return 6-8 weeks or prn.  I spent 35 minutes providing this consultation,  from 0945 to 1020. More than 50% of the time in this consultation was spent coordinating communication.   CHIEF COMPLAINT: immobility, unable to change positions, glucose control, constipation  HISTORY OF PRESENT ILLNESS:  Robin Archer is a 74 y.o. year old female with multiple medical problems including DM, MG, debility, immobility, constipation. Palliative Care was asked to follow this patient by consultation request of Marisa Hua, MD to help address advance care planning and goals of care. This is a follow up visit.  CODE STATUS: DNR  PPS: 30%  HOSPICE ELIGIBILITY/DIAGNOSIS: TBD  PAST MEDICAL HISTORY:  Past Medical History:  Diagnosis Date  . DM (diabetes mellitus) ([place])   . GERD (gastroesophageal reflux disease)   . Hyperlipidemia   . Hypertension   . Memory loss   . Myasthenia gravis ([place] [place])   . Spinal stenosis   . Vitamin B deficiency     SOCIAL HX:  Social History   Tobacco Use  . Smoking status: Never Smoker  Substance Use Topics  . Alcohol use: No    Alcohol/week: 0.0 standard drinks   FAMILY HX: No family history on file.  ALLERGIES: No Known  Allergies   PERTINENT MEDICATIONS:  Outpatient Encounter Medications as of 03/26/2020  Medication Sig  . atorvastatin (LIPITOR) 10 MG tablet Take 10 mg by mouth daily.  . bisacodyl (DULCOLAX) 10 MG suppository Place 10 mg rectally 4 (four) times a week. Insert 1 pr at bedtime on Sunday, Monday, Wednesday, Friday.  . ferrous sulfate 220 (44 Fe) MG/5ML solution Take 220 mg by mouth daily.  . hydroxypropyl methylcellulose / hypromellose (ISOPTO TEARS /  GONIOVISC) 2.5 % ophthalmic solution Place 2 drops into both eyes 4 (four) times daily.  . insulin detemir (LEVEMIR) 100 UNIT/ML injection Inject 18 Units into the skin 2 (two) times daily.  . insulin regular (NOVOLIN R) 100 units/mL injection Inject 10 Units into the skin 2 (two) times daily as needed for high blood sugar (BG > 350 mg/ dl).  . metoprolol tartrate (LOPRESSOR) 50 MG tablet Take 50 mg by mouth 2 (two) times daily.  . Omega-3 Fatty Acids (FISH OIL) 1000 MG CAPS Take 1 capsule by mouth daily.  . ondansetron (ZOFRAN) 4 MG tablet Take 4 mg by mouth every 6 (six) hours as needed for nausea or vomiting.  . pantoprazole (PROTONIX) 40 MG tablet Take 40 mg by mouth daily.  . polyethylene glycol (MIRALAX / GLYCOLAX) packet Take 17 g by mouth 2 (two) times daily.  . [DISCONTINUED] insulin regular (NOVOLIN R,HUMULIN R) 100 units/mL injection Inject 10 Units into the skin 2 (two) times daily as needed. For bg > 350  . [DISCONTINUED] insulin glargine (LANTUS) 100 UNIT/ML injection Inject 18 Units into the skin every morning. And take 40 units a bed time   No facility-administered encounter medications on file as of 03/26/2020.     PHYSICAL EXAM / ROS:   Current and past weights: 110, wt loss of 3 lbs in 4 mos ( 3%) General: NAD, frail appearing, thin Cardiovascular: no chest pain reported, no LE edema  Pulmonary: no cough, no increased SOB, room air Abdomen: appetite fair to poor, endorses constipation, incontinent of bowel GU: denies dysuria, incontinent of urine MSK:  ++ joint and ROM abnormalities, non ambulatory due to MG Skin: no rashes or wounds reported Neurological: Weakness, deficits from MG, unable to move (I)  Jason Coop, NP , DNP, MPH, [facility]  COVID-19 PATIENT SCREENING TOOL  Person answering questions: ____________Staff_______ _____   1.  Is the patient or any family member in the home showing any signs or symptoms regarding respiratory infection?                Person with Symptom- __________NA_________________  a. Fever                                                                          Yes___ No___          ___________________  b. Shortness of breath                                                    Yes___ No___          ___________________ c. Cough/congestion  Yes___  No___         ___________________ d. Body aches/pains                                                         Yes___ No___        ____________________ e. Gastrointestinal symptoms (diarrhea, nausea)           Yes___ No___        ____________________  2. Within the past 14 days, has anyone living in the home had any contact with someone with or under investigation for COVID-19?    Yes___ No_X_   Person __________________   

## 2020-06-23 ENCOUNTER — Other Ambulatory Visit: Payer: Self-pay

## 2020-06-23 ENCOUNTER — Non-Acute Institutional Stay: Payer: Medicare Other | Admitting: Primary Care

## 2020-06-23 DIAGNOSIS — Z515 Encounter for palliative care: Secondary | ICD-10-CM

## 2020-06-23 DIAGNOSIS — E1165 Type 2 diabetes mellitus with hyperglycemia: Secondary | ICD-10-CM

## 2020-06-23 DIAGNOSIS — B351 Tinea unguium: Secondary | ICD-10-CM

## 2020-06-23 DIAGNOSIS — G7000 Myasthenia gravis without (acute) exacerbation: Secondary | ICD-10-CM

## 2020-06-23 DIAGNOSIS — Z794 Long term (current) use of insulin: Secondary | ICD-10-CM

## 2020-06-23 NOTE — Progress Notes (Signed)
[facility] Consult Note Telephone: [phone]  Fax: [phone]  PATIENT NAME: Robin Archer [place] [address] [phone] (home) [phone] (work) DOB: [birth year] MRN: [id number]  PRIMARY CARE PROVIDER:    Marisa Hua, MD,  [place] [address] [phone]  REFERRING PROVIDER:   Marisa Hua, MD [address] [place],  [place] [address] [phone]  RESPONSIBLE PARTY:   Extended Emergency Contact Information Primary Emergency Contact: Stutts,[name] Address: [address]          [place], [place] [address] [name] of [place] Mobile Phone: [phone] Relation: Daughter Secondary Emergency Contact: Debbrah Alar Address: [address]  [place], [place] [address] Home Phone: [phone] Work Phone: [phone] Relation: None  I met face to face with patient in facility.   ASSESSMENT AND RECOMMENDATIONS:   1. Advance Care Planning/Goals of Care: Goals include to maximize quality of life and symptom management. T/c to [facility] [name], no answer, no ability to leave message as mail box is full. T/c to [name], no answer, message left. Let them know her sugars are better and that we'd like her to have low glucose nutritional  Supplements.DNR order on file at SNF.  2. Symptom Management:   Nutrition: Recently changed to ADA diet and sugars are now in more reasonable ranges, 100- low 200's vs mid 300's. D/c medpass due to sugar content and 4 lb weight gain in 3 mos. Pt refusing it so that is reason for d/c. Recommend glucerna or some sort of low carb supplement eg Boost Max  if acceptable to pt.    Med review: recommend to d/c fish oil as pt refuses most days due to size. She is on a statin.   3. Follow up Palliative Care Visit: Palliative care will continue to follow for goals of care clarification and symptom management. Return 8 weeks or prn.  4. Family  /Caregiver/Community Supports: Lives in [place], has 2 daughters in the area who are PR.  5. Cognitive / Functional decline: A and O x 1, nods/shakes head, non verbal. Dependent in all alds, iadls.   I spent 35 minutes providing this consultation,  from 1000 to 1035. More than 50% of the time in this consultation was spent coordinating communication.   CHIEF COMPLAINT: immobility, hyperglycemia  HISTORY OF PRESENT ILLNESS:  Robin Archer is a 74 y.o. year old female  with immobility, hyperglycemia. Recently was changed from reg to diabetic diet at SNF. Staff reports glucose now in 100-200 range instead of 350-400. Patient has gained some weight. She continues to refuse diet supplements. We are asked to consult around progressive terminal disease management.    Palliative Care was asked to follow this patient by consultation request of Marisa Hua, MD to help address advance care planning and goals of care. This is a follow up visit.  CODE STATUS: DNR  PPS: 40%  HOSPICE ELIGIBILITY/DIAGNOSIS: no  ROS (With SNF staff input)  General: NAD Cardiovascular: denies chest pain Pulmonary: denies  cough, denies increased SOB,  Abdomen: endorses fair appetite, denies constipation, endorses incontinence of bowel GU: denies dysuria, endorses incontinence of urine MSK:  endorses ROM limitations, no falls reported Skin: denies rashes or wounds Neurological: endorses weakness, denies pain, denies insomnia Psych: staff ndorses anxious mood   Physical Exam: Current and past weights: 108 lb in 7/21, now 112 lbs. Constitutional:  NAD General :  frail appearing, thin EYES: anicteric sclera,EOMI, lids intact, no discharge  ENMT: intact hearing,oral mucous membranes moist,  unable to speak due to MG CV: S1S2, RRR, no LE edema Pulmonary: LCTA, no increased work of breathing, no cough, no audible wheezes, room air Abdomen: intake 75-100%, soft and non tender, no ascites GU: deferred MSK: advanced  sacropenia, lack of ROM in all extremities, + contractures of LE, non ambulatory Skin: warm and dry, no rashes or wounds on visible skin Neuro: Weakness, mild cognitive impairment, motor loss from MG Psych: non anxious affect today, A and O x 2  CURRENT PROBLEM LIST:  Patient Active Problem List   Diagnosis Date Noted  . Mixed Alzheimer's and vascular dementia ([facility]) 05/26/2015  . Acute encephalopathy 04/15/2015  . Dementia ([place]) 04/15/2015  . Right humeral fracture 04/15/2015  . Fracture of left clavicle 04/15/2015  . Tachycardia 04/15/2015  . HTN (hypertension) 04/15/2015  . Myasthenia gravis ([place]) 04/15/2015  . Type II diabetes mellitus ([place]) 04/15/2015  . GERD (gastroesophageal reflux disease) 04/15/2015  . Onychomycosis due to dermatophyte 02/19/2014   PAST MEDICAL HISTORY:  Past Medical History:  Diagnosis Date  . DM (diabetes mellitus) ([facility] [place])   . GERD (gastroesophageal reflux disease)   . Hyperlipidemia   . Hypertension   . Memory loss   . Myasthenia gravis ([place])   . Spinal stenosis   . Vitamin B deficiency     SOCIAL HX:  Social History   Tobacco Use  . Smoking status: Never Smoker  Substance Use Topics  . Alcohol use: No    Alcohol/week: 0.0 standard drinks   FAMILY HX: No family history on file.  ALLERGIES: No Known Allergies   PERTINENT MEDICATIONS:  Outpatient Encounter Medications as of 06/23/2020  Medication Sig  . atorvastatin (LIPITOR) 10 MG tablet Take 10 mg by mouth daily.  . bisacodyl (DULCOLAX) 10 MG suppository Place 10 mg rectally 4 (four) times a week. Insert 1 pr at bedtime on Sunday, Monday, Wednesday, Friday.  . ferrous sulfate 220 (44 Fe) MG/5ML solution Take 220 mg by mouth daily.  . hydroxypropyl methylcellulose / hypromellose (ISOPTO TEARS / GONIOVISC) 2.5 % ophthalmic solution Place 2 drops into both eyes 4 (four) times daily.  . insulin detemir (LEVEMIR) 100 UNIT/ML injection Inject 18 Units into the skin at bedtime. Hold for BG < 100    . insulin detemir (LEVEMIR) 100 UNIT/ML injection Inject 22 Units into the skin daily. In the AM, hold for BG < 100  . insulin regular (NOVOLIN R) 100 units/mL injection Inject 10 Units into the skin 2 (two) times daily as needed for high blood sugar (BG > 350 mg/ dl).  . metoprolol tartrate (LOPRESSOR) 50 MG tablet Take 50 mg by mouth 2 (two) times daily.  . Omega-3 Fatty Acids (FISH OIL) 1000 MG CAPS Take 1 capsule by mouth daily.  . ondansetron (ZOFRAN) 4 MG tablet Take 4 mg by mouth every 6 (six) hours as needed for nausea or vomiting.  . pantoprazole (PROTONIX) 40 MG tablet Take 40 mg by mouth daily.  . polyethylene glycol (MIRALAX / GLYCOLAX) packet Take 17 g by mouth 2 (two) times daily.   No facility-administered encounter medications on file as of 06/23/2020.     Jason Coop, NP , DNP, MPH, AGPCNP-BC, ACHPN  COVID-19 PATIENT SCREENING TOOL  Person answering questions: ____________staff______ _____   1.  Is the patient or any family member in the home showing any signs or symptoms regarding respiratory infection?  Person with Symptom- __________NA_________________  a. Fever                                                                          Yes___ No___          ___________________  b. Shortness of breath                                                    Yes___ No___          ___________________ c. Cough/congestion                                       Yes___  No___         ___________________ d. Body aches/pains                                                         Yes___ No___        ____________________ e. Gastrointestinal symptoms (diarrhea, nausea)           Yes___ No___        ____________________  2. Within the past 14 days, has anyone living in the home had any contact with someone with or under investigation for COVID-19?    Yes___ No_X_   Person __________________   

## 2020-10-15 ENCOUNTER — Non-Acute Institutional Stay: Payer: Medicare Other | Admitting: Primary Care

## 2020-10-15 ENCOUNTER — Other Ambulatory Visit: Payer: Self-pay

## 2020-10-15 DIAGNOSIS — G7000 Myasthenia gravis without (acute) exacerbation: Secondary | ICD-10-CM

## 2020-10-15 DIAGNOSIS — Z515 Encounter for palliative care: Secondary | ICD-10-CM

## 2020-10-15 NOTE — Progress Notes (Signed)
[facility] Consult Note Telephone: [phone]  Fax: [phone]     Date of encounter: 10/15/20 PATIENT NAME: Robin Archer [place] [address] [phone] (home) [phone] (work) DOB: 09-Apr-1946 MRN: [id number]  PRIMARY CARE PROVIDER:    Alvester Morin, MD [phone] [address]. [place] [address] [phone]  REFERRING PROVIDER:   Alvester Morin, MD [place]. [place] [address] [phone]   RESPONSIBLE PARTY:   Extended Emergency Contact Information Primary Emergency Contact: Stutts,[name] Address: [place], [place] [address] [name] of [place] Mobile Phone: [phone] Relation: Daughter Secondary Emergency Contact: Debbrah Alar Address: [address]  [place], [place] [address] Home Phone: [phone] Work Phone: [phone] Relation: None  I met face to face with patient in facility. Palliative Care was asked to follow this patient by consultation request of Slade-Hartman, Ivette Loyal*  to help address advance care planning and goals of care. This is a follow up  visit.   ASSESSMENT AND RECOMMENDATIONS:   1. Advance Care Planning/Goals of Care: Goals include to maximize quality of life and symptom management. DNR on file  2. Symptom Management:   Staff reports she is refusing meds and sometimes not eating. Will monitor for changes in why this is happening. Alert and interactive with interview today.   3. Follow up Palliative Care Visit: Palliative care will continue to follow for goals of care clarification and symptom management. Return 6-8 weeks or prn.  4. Family /Caregiver/Community Supports: Daughter are POA, lives in [place]  5. Cognitive / Functional decline: A and o x 1, non verbal, shakes head appro. Dependent in all adls.  I spent 25 minutes providing this consultation,  from 1100 to 1125. More than  50% of the time in this consultation was spent in counseling and care coordination.  CODE STATUS:DNR  PPS: 30%  HOSPICE ELIGIBILITY/DIAGNOSIS: TBD  Subjective:  CHIEF COMPLAINT: debility  HISTORY OF PRESENT ILLNESS:  Robin Archer is a 75 y.o. year old female  with MG and debility. Lives in [place], total care .   We are asked to consult around advance care planning and complex medical decision making.    History obtained from review of EMR, discussion with primary team, and  interview with family, caregiver  and/or [name]. Records reviewed and summarized above.   CURRENT PROBLEM LIST:  Patient Active Problem List   Diagnosis Date Noted  . Mixed Alzheimer's and vascular dementia ([facility]) 05/26/2015  . Acute encephalopathy 04/15/2015  . Dementia ([place]) 04/15/2015  . Right humeral fracture 04/15/2015  . Fracture of left clavicle 04/15/2015  . Tachycardia 04/15/2015  . HTN (hypertension) 04/15/2015  . Myasthenia gravis ([facility]) 04/15/2015  . Type II diabetes mellitus ([facility]) 04/15/2015  . GERD (gastroesophageal reflux disease) 04/15/2015  . Onychomycosis due to dermatophyte 02/19/2014   PAST MEDICAL HISTORY:  Active Ambulatory Problems    Diagnosis Date Noted  . Acute encephalopathy 04/15/2015  . Dementia ([facility]) 04/15/2015  . Right humeral fracture 04/15/2015  . Fracture of left clavicle 04/15/2015  . Tachycardia 04/15/2015  . HTN (hypertension) 04/15/2015  . Myasthenia gravis ([facility]) 04/15/2015  . Type II diabetes mellitus ([place]) 04/15/2015  . GERD (gastroesophageal reflux disease) 04/15/2015  . Mixed Alzheimer's and vascular dementia ([facility]) 05/26/2015  . Onychomycosis due to dermatophyte 02/19/2014   Resolved Ambulatory Problems    Diagnosis  Date Noted  . No Resolved Ambulatory Problems   Past Medical History:  Diagnosis Date  . DM (diabetes mellitus) ([facility])   . Hyperlipidemia   . Hypertension   . Memory loss   . Spinal stenosis   . Vitamin B deficiency    SOCIAL HX:   Social History   Tobacco Use  . Smoking status: Never Smoker  . Smokeless tobacco: Not on file  Substance Use Topics  . Alcohol use: No    Alcohol/week: 0.0 standard drinks   FAMILY HX: No family history on file.    ALLERGIES: No Known Allergies   PERTINENT MEDICATIONS:  Outpatient Encounter Medications as of 10/15/2020  Medication Sig  . atorvastatin (LIPITOR) 10 MG tablet Take 10 mg by mouth daily.  . bisacodyl (DULCOLAX) 10 MG suppository Place 10 mg rectally 4 (four) times a week. Insert 1 pr at bedtime on Sunday, Monday, Wednesday, Friday.  . ferrous sulfate 220 (44 Fe) MG/5ML solution Take 220 mg by mouth daily.  . hydroxypropyl methylcellulose / hypromellose (ISOPTO TEARS / GONIOVISC) 2.5 % ophthalmic solution Place 2 drops into both eyes 4 (four) times daily.  . insulin detemir (LEVEMIR) 100 UNIT/ML injection Inject 12 Units into the skin at bedtime. Hold for BG < 100  . insulin detemir (LEVEMIR) 100 UNIT/ML injection Inject 26 Units into the skin daily. In the AM, hold for BG < 100  . insulin regular (NOVOLIN R) 100 units/mL injection Inject 10 Units into the skin 2 (two) times daily as needed for high blood sugar (BG > 350 mg/ dl).  . metoprolol tartrate (LOPRESSOR) 50 MG tablet Take 50 mg by mouth 2 (two) times daily.  . Omega-3 Fatty Acids (FISH OIL) 1000 MG CAPS Take 1 capsule by mouth daily.  . ondansetron (ZOFRAN) 4 MG tablet Take 4 mg by mouth every 6 (six) hours as needed for nausea or vomiting.  . pantoprazole (PROTONIX) 40 MG tablet Take 40 mg by mouth daily.  . polyethylene glycol (MIRALAX / GLYCOLAX) packet Take 17 g by mouth 2 (two) times daily.   No facility-administered encounter medications on file as of 10/15/2020.    Objective: ROS/staff  General: NAD ENMT: denies dysphagia Cardiovascular: denies chest pain Pulmonary: denies  cough, denies increased SOB Abdomen: endorses fair to poor appetite, denies  constipation, endorses incontinence of bowel GU:  denies dysuria, endorses incontinence of urine MSK:  endorses ROM limitations, no falls reported Skin: denies rashes or wounds Neurological: endorses weakness, function loss d/t MG, denies pain, denies insomnia Psych: Endorses flat mood Heme/lymph/immuno: denies bruises, abnormal bleeding  Physical Exam: Current and past weights:  Stable at 113 lbs Constitutional:  NAD General: frail appearing, thin EYES: anicteric sclera, lids intact, no discharge  ENMT: intact hearing,oral mucous membranes moist CV: S1S2, RRR, no LE edema Pulmonary: LCTA, no increased work of breathing, no cough, no audible wheezes, room air Abdomen: intake 50%, normo-active BS +  4 quadrants, soft and non tender, no ascites GU: deferred MSK: severe sarcopenia, decreased ROM in all extremities, + contractures of LE, non ambulatory Skin: warm and dry, no rashes or wounds on visible skin Neuro: Generalized weakness, + cognitive impairment Psych: non-anxious affect, A and O x 1 Hem/lymph/immuno: no widespread bruising   Thank you for the opportunity to participate in the care of [name].  The palliative care team will continue to follow. Please call our office at [phone] if we can be of additional assistance.  Jason Coop, NP , DNP, MPH,  AGPCNP-BC, ACHPN   COVID-19 PATIENT SCREENING TOOL  Person answering questions: _______staff____________   1.  Is the patient or any family member in the home showing any signs or symptoms regarding respiratory infection?                  Person with Symptom  ______________na___________ a. Fever/chills/headache                                                        Yes___ No__X_            b. Shortness of breath                                                            Yes___ No__X_           c. Cough/congestion                                               Yes___  No__X_          d. Muscle/Body aches/pains                                                    Yes___ No__X_         e. Gastrointestinal symptoms (diarrhea,nausea)             Yes___ No__X_         f. Sudden loss of smell or taste      Yes___ No__X_        2. Within the past 10 days, has anyone living in the home had any contact with someone with or under investigation for COVID-19?    Yes___ No__X__   Person __________________

## 2020-11-08 ENCOUNTER — Non-Acute Institutional Stay: Payer: Medicare Other | Admitting: Primary Care

## 2020-11-08 ENCOUNTER — Other Ambulatory Visit: Payer: Self-pay

## 2020-11-08 DIAGNOSIS — Z515 Encounter for palliative care: Secondary | ICD-10-CM

## 2020-11-08 DIAGNOSIS — G7000 Myasthenia gravis without (acute) exacerbation: Secondary | ICD-10-CM

## 2020-11-08 NOTE — Progress Notes (Signed)
[facility] Consult Note Telephone: [phone]  Fax: [phone]    Date of encounter: 11/08/20 PATIENT NAME: Robin Archer [id number] [phone] (home) [phone] (work) DOB: 1945/12/07 MRN: [id number]  PRIMARY CARE PROVIDER:    Alvester Morin, MD,  [place]. [place] [address] [phone]  REFERRING PROVIDER:   Alvester Morin, MD [address]. [place],  [place] [address] [phone]  RESPONSIBLE PARTY:   Extended Emergency Contact Information Primary Emergency Contact: Stutts,[name] Address: [place], [place] [address] [name] of [place] Mobile Phone: [phone] Relation: Daughter Secondary Emergency Contact: Debbrah Alar Address: [address]  [place], [place] [address] Home Phone: [phone] Work Phone: [phone] Relation: None  I met face to face with patient in the facility. Palliative Care was asked to follow this patient by consultation request of Slade-Hartman, Ivette Loyal* to help address advance care planning and goals of care. This is a follow up  visit.   ASSESSMENT AND RECOMMENDATIONS:   1. Advance Care Planning/Goals of Care: Goals include to maximize quality of life and symptom management.  DNR on record pre most form.  2. Symptom Management:   I met with patient in her nursing home room. Staff report she had vomited over the weekend and received some IV fluids. She is currently being fed by the speech assistant to monitor for swallowing issues. She is being prompted to take a bite of food and then drink some liquid behind it. She states she is able to do this and feels better. She's able to smile and nod her head in answer to questions. She denies current nausea or malaise and her vitals Are within normal limits except for an elevated blood pressure at 189/80. These were reported to the  facility provider per SNF staff.  3. Follow up Palliative Care Visit: Palliative care will continue to follow for goals of care clarification and symptom management. Return 4-6 weeks or prn.  4. Family /Caregiver/Community Supports: Daughters are [name]. Lives in [place]  5. Cognitive / Functional decline: A and O x 2, dependent in all adls  And iadls.  I spent 25 minutes providing this consultation,  from 1200 to 1225. More than 50% of the time in this consultation was spent in counseling and care coordination.  CODE STATUS: DNR  PPS: 40%  HOSPICE ELIGIBILITY/DIAGNOSIS: TBD  Subjective:  CHIEF COMPLAINT: nausea, vomiting dysphagia  HISTORY OF PRESENT ILLNESS:  Robin Archer is a 75 y.o. year old female  with MG, immobility, n/v.  We are asked to consult around advance care planning and complex medical decision making.    Review and summarization of old Epic records shows or history from other than patient.  History obtained from review of EMR, discussion with primary team, and  interview with family, caregiver  and/or [name]. Records reviewed and summarized above.   CURRENT PROBLEM LIST:  Patient Active Problem List   Diagnosis Date Noted  . Mixed Alzheimer's and vascular dementia ([facility]) 05/26/2015  . Acute encephalopathy 04/15/2015  . Dementia ([facility]) 04/15/2015  . Right humeral fracture 04/15/2015  . Fracture of left clavicle 04/15/2015  . Tachycardia 04/15/2015  . HTN (hypertension) 04/15/2015  . Myasthenia gravis ([facility]) 04/15/2015  . Type II diabetes mellitus ([facility]) 04/15/2015  . GERD (gastroesophageal reflux disease) 04/15/2015  . Onychomycosis due to dermatophyte  02/19/2014   PAST MEDICAL HISTORY:  Active Ambulatory Problems    Diagnosis Date Noted  . Acute encephalopathy 04/15/2015  . Dementia ([facility]) 04/15/2015  . Right humeral fracture 04/15/2015  . Fracture of left clavicle 04/15/2015  . Tachycardia 04/15/2015  . HTN (hypertension) 04/15/2015  . Myasthenia gravis  ([facility]) 04/15/2015  . Type II diabetes mellitus ([facility]) 04/15/2015  . GERD (gastroesophageal reflux disease) 04/15/2015  . Mixed Alzheimer's and vascular dementia ([facility]) 05/26/2015  . Onychomycosis due to dermatophyte 02/19/2014   Resolved Ambulatory Problems    Diagnosis Date Noted  . No Resolved Ambulatory Problems   Past Medical History:  Diagnosis Date  . DM (diabetes mellitus) ([facility])   . Hyperlipidemia   . Hypertension   . Memory loss   . Spinal stenosis   . Vitamin B deficiency    SOCIAL HX:  Social History   Tobacco Use  . Smoking status: Never Smoker  . Smokeless tobacco: Not on file  Substance Use Topics  . Alcohol use: No    Alcohol/week: 0.0 standard drinks   FAMILY HX: No family history on file.    ALLERGIES: No Known Allergies   PERTINENT MEDICATIONS:  Outpatient Encounter Medications as of 11/08/2020  Medication Sig  . atorvastatin (LIPITOR) 10 MG tablet Take 10 mg by mouth daily.  . bisacodyl (DULCOLAX) 10 MG suppository Place 10 mg rectally 4 (four) times a week. Insert 1 pr at bedtime on Sunday, Monday, Wednesday, Friday.  . ferrous sulfate 220 (44 Fe) MG/5ML solution Take 220 mg by mouth daily.  . hydroxypropyl methylcellulose / hypromellose (ISOPTO TEARS / GONIOVISC) 2.5 % ophthalmic solution Place 2 drops into both eyes 4 (four) times daily.  . insulin detemir (LEVEMIR) 100 UNIT/ML injection Inject 12 Units into the skin at bedtime. Hold for BG < 100  . insulin detemir (LEVEMIR) 100 UNIT/ML injection Inject 26 Units into the skin daily. In the AM, hold for BG < 100  . insulin regular (NOVOLIN R) 100 units/mL injection Inject 10 Units into the skin 2 (two) times daily as needed for high blood sugar (BG > 350 mg/ dl).  . metoprolol tartrate (LOPRESSOR) 50 MG tablet Take 50 mg by mouth 2 (two) times daily.  . Omega-3 Fatty Acids (FISH OIL) 1000 MG CAPS Take 1 capsule by mouth daily.  . ondansetron (ZOFRAN) 4 MG tablet Take 4 mg by mouth every 6 (six) hours as  needed for nausea or vomiting.  . pantoprazole (PROTONIX) 40 MG tablet Take 40 mg by mouth daily.  . polyethylene glycol (MIRALAX / GLYCOLAX) packet Take 17 g by mouth 2 (two) times daily.   No facility-administered encounter medications on file as of 11/08/2020.    Objective: ROS  General: NAD ENMT: endorses dysphagia Cardiovascular: denies chest pain Pulmonary: denies  cough, denies increased SOB Abdomen: endorses fair appetite, denies constipation, endorses incontinence of bowel GU: denies dysuria, endorses incontinence of urine MSK:  endorses severe ROM limitations, no falls reported Skin: denies rashes or wounds Neurological: endorses weakness, MG deficits, denies pain, denies insomnia Psych: Endorses positive mood Heme/lymph/immuno: denies bruises, abnormal bleeding  Physical Exam: Current and past weights: unavailable Constitutional: 189/80 HR 78 RR 18  T 98.0 General: frail appearing, thin EYES: anicteric sclera, lids intact, no discharge  ENMT: intact hearing,oral mucous membranes moist Pulmonary: no increased work of breathing, no cough, no audible wheezes, room air Abdomen: intake 25%,  no ascites MSK: severe sarcopenia, unable to perform ROM in all extremities, +contractures of LE, non  ambulatory Skin: warm and dry, no rashes or wounds on visible skin Neuro: Generalized weakness, + cognitive impairment Psych: non-anxious affect, A and O x 1 Hem/lymph/immuno: no widespread bruising   Thank you for the opportunity to participate in the care of [name].  The palliative care team will continue to follow. Please call our office at [phone] if we can be of additional assistance.  Jason Coop, NP , DNP, MPH, AGPCNP-BC, ACHPN   COVID-19 PATIENT SCREENING TOOL  Person answering questions: _______staff____________   1.  Is the patient or any family member in the home showing any signs or symptoms regarding respiratory infection?                  Person  with Symptom  ______________na___________ a. Fever/chills/headache                                                        Yes___ No__X_            b. Shortness of breath                                                            Yes___ No__X_           c. Cough/congestion                                               Yes___  No__X_          d. Muscle/Body aches/pains                                                   Yes___ No__X_         e. Gastrointestinal symptoms (diarrhea,nausea)             Yes___ No__X_         f. Sudden loss of smell or taste      Yes___ No__X_        2. Within the past 10 days, has anyone living in the home had any contact with someone with or under investigation for COVID-19?    Yes___ No__X__   Person __________________

## 2020-12-17 ENCOUNTER — Non-Acute Institutional Stay: Payer: Medicare Other | Admitting: Primary Care

## 2020-12-17 ENCOUNTER — Other Ambulatory Visit: Payer: Self-pay

## 2020-12-17 DIAGNOSIS — F0150 Vascular dementia without behavioral disturbance: Secondary | ICD-10-CM

## 2020-12-17 DIAGNOSIS — E1165 Type 2 diabetes mellitus with hyperglycemia: Secondary | ICD-10-CM

## 2020-12-17 DIAGNOSIS — G309 Alzheimer's disease, unspecified: Secondary | ICD-10-CM

## 2020-12-17 DIAGNOSIS — Z794 Long term (current) use of insulin: Secondary | ICD-10-CM

## 2020-12-17 DIAGNOSIS — G7000 Myasthenia gravis without (acute) exacerbation: Secondary | ICD-10-CM

## 2020-12-17 DIAGNOSIS — Z515 Encounter for palliative care: Secondary | ICD-10-CM

## 2020-12-17 NOTE — Progress Notes (Signed)
[facility] Pall[facility] Care Consult Note Telephone: [phone]  Fax: [phone]    Date of encounter: 12/17/20 PATIENT NAME: Robin Archer [place] [address]   [phone] (home) [phone] (work) DOB: [birth year] MRN: [id number] PRIMARY CARE PROVIDER:    Alvester Morin, MD,  [place]. Robin Archer [place] [address] [phone]  REFERRING PROVIDER:   Alvester Morin, MD [address]. [place],  [place] [address] [phone]  RESPONSIBLE PARTY:    Contact Information    Name Relation Home Work Mobile   Archer,Robin Daughter   [phone]   Robin Archer  [phone]       I met face to face with patient in [facility]. Palliative Care was asked to follow this patient by consultation request of  Robin Archer, Robin Archer* to address advance care planning and complex medical decision making. This is the follow up visit.     ASSESSMENT AND RECOMMENDATIONS:   1. Advance Care Planning/Goals of Care: Goals include to maximize quality of life and symptom management. Our advance care planning conversation included a discussion about:      Experiences with loved ones who have been seriously ill or have died   Exploration of personal, cultural or spiritual beliefs that might influence medical decisions  Reached daughter and surrogate Robin. Discussed pt wellbeing and any recent changes arising from her refusal of most medications. No changes in advance plans. Discussed course of MG and lengthy nature of this disease.  2. Symptom Management:  I met with patient in her nursing home room.   Medication management: Staff endorses that she continues to refuse her medication. They are able to give her insulin but her glucose is have been running fairly low with the highest in the low 200s. She may benefit from a reduction in the basal amount consistent with less intake. That said, her  weight remains stable.  I would recommend to d/c meds since she has not taken for some months, and does not seem to have ill effects. Recommend to continue insulin but perhaps can be combined or decreased from am and pm dosing if she reduces intake.  Mood this appears to be at baseline. Staff denied any behavior disturbances or signs of depression. She is nonverbal but able to make eye contact and nod yes and no.   Mobility patient is largely bedbound with lower extremity contractures from her Neuro- muscular disorder. They are able to get her up until chair to sit daily for a few hours.   Nutrition she continues to eat about 50% but is able to maintain weight on that. She does require total assistance for feeding. She does not appear to be showing any symptoms of feeding problems at this time. She had a weight of 108 6 months ago and today it remains around 110 to 112 lbs.  3. Follow up Palliative Care Visit: Palliative care will continue to follow for advance care planning and  clarification and symptom management. Return 6 weeks or prn.  4. Family /Caregiver/Community Supports: Daughters are pr, lives in [place]  5. Cognitive / Functional decline: A and o x 1, non verbal, dependent in all adls and iadls.  I spent 35 minutes providing this consultation. More than 50% of the time in this consultation was spent in counseling and care coordination.  CODE STATUS: DNR  PPS: 30%  HOSPICE ELIGIBILITY/DIAGNOSIS: TBD  Medical reason for visit: assess for effects of medication refusal, nutritional status.  HISTORY OF  PRESENT ILLNESS:  Robin Archer is a 75 y.o. year old female  with MG, DM, debility and frailty .   History obtained from review of EMR, discussion with primary team, and  interview with family, caregiver  and/or Ms. Robin Archer. Records reviewed and summarized above.   CURRENT PROBLEM LIST:  Patient Active Problem List   Diagnosis Date Noted  . Mixed Alzheimer's and vascular dementia  ([place]) 05/26/2015  . Acute encephalopathy 04/15/2015  . Dementia ([place]) 04/15/2015  . Right humeral fracture 04/15/2015  . Fracture of left clavicle 04/15/2015  . Tachycardia 04/15/2015  . HTN (hypertension) 04/15/2015  . Myasthenia gravis ([place]) 04/15/2015  . Type II diabetes mellitus ([place]) 04/15/2015  . GERD (gastroesophageal reflux disease) 04/15/2015  . Onychomycosis due to dermatophyte 02/19/2014   PAST MEDICAL HISTORY:  Active Ambulatory Problems    Diagnosis Date Noted  . Acute encephalopathy 04/15/2015  . Dementia ([place]) 04/15/2015  . Right humeral fracture 04/15/2015  . Fracture of left clavicle 04/15/2015  . Tachycardia 04/15/2015  . HTN (hypertension) 04/15/2015  . Myasthenia gravis ([place]) 04/15/2015  . Type II diabetes mellitus ([place]) 04/15/2015  . GERD (gastroesophageal reflux disease) 04/15/2015  . Mixed Alzheimer's and vascular dementia ([place]) 05/26/2015  . Onychomycosis due to dermatophyte 02/19/2014   Resolved Ambulatory Problems    Diagnosis Date Noted  . No Resolved Ambulatory Problems   Past Medical History:  Diagnosis Date  . DM (diabetes mellitus) ([place])   . Hyperlipidemia   . Hypertension   . Memory loss   . Spinal stenosis   . Vitamin B deficiency    SOCIAL HX:  Social History   Tobacco Use  . Smoking status: Never Smoker  . Smokeless tobacco: Not on file  Substance Use Topics  . Alcohol use: No    Alcohol/week: 0.0 standard drinks   FAMILY HX: No family history on file.  No further family history attainable from patient or on chart review; no family present.   ALLERGIES: No Known Allergies   PERTINENT MEDICATIONS:  Outpatient Encounter Medications as of 12/17/2020  Medication Sig  . insulin detemir (LEVEMIR) 100 UNIT/ML injection Inject 12 Units into the skin at bedtime. Hold for BG < 100  . insulin detemir (LEVEMIR) 100 UNIT/ML injection Inject 26 Units into the skin daily. In the AM, hold for BG < 100  . atorvastatin (LIPITOR) 10 MG tablet  Take 10 mg by mouth daily.  . bisacodyl (DULCOLAX) 10 MG suppository Place 10 mg rectally 4 (four) times a week. Insert 1 pr at bedtime on Sunday, Monday, Wednesday, Friday.  . ferrous sulfate 220 (44 Fe) MG/5ML solution Take 220 mg by mouth daily.  . hydroxypropyl methylcellulose / hypromellose (ISOPTO TEARS / GONIOVISC) 2.5 % ophthalmic solution Place 2 drops into both eyes 4 (four) times daily.  . insulin regular (NOVOLIN R) 100 units/mL injection Inject 10 Units into the skin 2 (two) times daily as needed for high blood sugar (BG > 350 mg/ dl). (Patient not taking: Reported on 12/17/2020)  . metoprolol tartrate (LOPRESSOR) 50 MG tablet Take 50 mg by mouth 2 (two) times daily.  . Omega-3 Fatty Acids (FISH OIL) 1000 MG CAPS Take 1 capsule by mouth daily.  . ondansetron (ZOFRAN) 4 MG tablet Take 4 mg by mouth every 6 (six) hours as needed for nausea or vomiting.  . pantoprazole (PROTONIX) 40 MG tablet Take 40 mg by mouth daily.  . polyethylene glycol (MIRALAX / GLYCOLAX) packet Take 17 g by mouth 2 (two) times  daily.   No facility-administered encounter medications on file as of 12/17/2020.     ROS/staff  General: NAD ENMT: denies dysphagia Pulmonary: denies cough, denies increased SOB Abdomen: endorses fair appetite, denies constipation, endorses incontinence of bowel GU: denies dysuria, endorses incontinence of urine MSK:  Endorses weakness,  no falls reported Skin: denies rashes or wounds Neurological: denies pain, denies insomnia Psych: Endorses positive mood  Physical Exam: Current and past weights:Stable around 108-112 lbs over 6-8 mos. Constitutional: NAD General: frail appearing, thin CV: no LE edema Pulmonary: no increased work of breathing, no cough, room air Abdomen: intake 50%, no ascites GU: deferred MSK: severe sarcopenia, contractures all extremities, non  ambulatory Skin: warm and dry, no rashes or wounds on visible skin Neuro:  +++ generalized weakness,  ++  cognitive impairment Psych: non-anxious affect, A and O x 1  Thank you for the opportunity to participate in the care of Ms. Robin Archer.  The palliative care team will continue to follow. Please call our office at [phone] if we can be of additional assistance.   Jason Coop, NP , DNP, MPH, AGPCNP-BC, ACHPN   COVID-19 PATIENT SCREENING TOOL Asked and negative response unless otherwise noted:   Have you had symptoms of covid, tested positive or been in contact with someone with symptoms/positive test in the past 5-10 days?

## 2021-02-11 ENCOUNTER — Other Ambulatory Visit: Payer: Self-pay

## 2021-02-11 ENCOUNTER — Non-Acute Institutional Stay: Payer: Medicare Other | Admitting: Primary Care

## 2021-02-11 DIAGNOSIS — F0150 Vascular dementia without behavioral disturbance: Secondary | ICD-10-CM

## 2021-02-11 DIAGNOSIS — Z515 Encounter for palliative care: Secondary | ICD-10-CM

## 2021-02-11 DIAGNOSIS — G309 Alzheimer's disease, unspecified: Secondary | ICD-10-CM

## 2021-02-11 DIAGNOSIS — F0280 Dementia in other diseases classified elsewhere without behavioral disturbance: Secondary | ICD-10-CM

## 2021-02-11 NOTE — Progress Notes (Signed)
[facility] Pall[facility] Consult Note Telephone: [phone]  Fax: [phone]    Date of encounter: 02/11/21 PATIENT NAME: Robin Archer [place] [address]   [phone] (home) [phone] (work) DOB: 08/08/1946 MRN: [id number] PRIMARY CARE PROVIDER:    Alvester Morin, MD,  [place]. [place] [address] [phone]  REFERRING PROVIDER:   Alvester Morin, MD [address]. [place],  [place] [address] [phone]  RESPONSIBLE PARTY:    Contact Information    Name Relation Home Work Mobile   Robin Archer,Robin Archer Daughter   [phone]   Robin Archer  [phone]       I met face to face with patient and family in [facility] facility. Palliative Care was asked to follow this patient by consultation request of  Robin Archer, Robin Loyal* to address advance care planning and complex medical decision making. This is a follow up visit.                                   ASSESSMENT AND PLAN / RECOMMENDATIONS:   Advance Care Planning/Goals of Care: Goals include to maximize quality of life and symptom management. Our advance care planning conversation included a discussion about:     Exploration of personal, cultural or spiritual beliefs that might influence medical decisions   CODE STATUS: DNR Spoke with daughter and POA TammyStaff reports they are monitoring glucose in the face of patient sporadic intake. It was slightly elevated in the 250s today but generally stays in reasonable control, 100-200's with occasional 300's or < 100. Has not had any glucose events. Discussed with daughter Robin Archer the role of palliative medicine and periodic visits. Will continue to follow for symptom management, clarification of goals of care and complex medical management.  Symptom Management/Plan:  I met with patient in her nursing home room. Today she is alert and interactive. She answers questions with  shakes of her head due to her Neuromuscular disease. She states she's feeling pretty good today and is smiling. She denies pain. Staff states that she still periodically refuses her pills. She has let people know that she is refusing medication because she wants to expedite her death. Discussed with family that does could mean more debility rather than death.   Staff reports they are monitoring glucose in the face of patient sporadic intake. It was slightly elevated in the 250s today but generally stays in reasonable control, 100-200's with occasional 300's or < 100. Has not had any glucose (high/low)  events.   Follow up Palliative Care Visit: Palliative care will continue to follow for complex medical decision making, advance care planning, and clarification of goals. Return 12 weeks or prn.  I spent 25 minutes providing this consultation. More than 50% of the time in this consultation was spent in counseling and care coordination.  PPS: 30%  HOSPICE ELIGIBILITY/DIAGNOSIS: TBD  Chief Complaint: debility  HISTORY OF PRESENT ILLNESS:  Robin Archer is a 75 y.o. year old female  with MG, debility, DM .   History obtained from review of EMR, discussion with primary team, and interview with family, facility staff/caregiver and/or Robin Archer.  I reviewed available labs, medications, imaging, studies and related documents from the EMR.  Records reviewed and summarized above.   ROS  General: NAD ENMT: denies dysphagia Pulmonary: denies cough, denies increased SOB Abdomen: endorses fair  appetite, denies constipation, endorses incontinence of bowel  GU: denies dysuria, endorses incontinence of urine MSK:  endorses weakness,  no falls reported Skin: denies rashes or wounds Neurological: denies pain, denies insomnia, Deficits from MG Psych: Endorses positive mood Heme/lymph/immuno: denies bruises, abnormal bleeding  Physical Exam: Current and past weights: 104 lbs, ranges +/- 8  lbs. Constitutional: NAD General: frail appearing, thin EYES: anicteric sclera, lids intact, no discharge  ENMT: intact hearing, oral mucous membranes moist, dentition intact, unable to speak CV: no LE edema Pulmonary:no increased work of breathing, no cough, room air Abdomen: intake 50%, no ascites MSK: severe  sarcopenia, immobile due to MG,  non ambulatory Skin: warm and dry, no rashes or wounds on visible skin Neuro:  Severe  generalized weakness,  Moderate cognitive impairment Psych: non-anxious affect, smiling, A and O x 2 Hem/lymph/immuno: no widespread bruising  Thank you for the opportunity to participate in the care of Robin Archer.  The palliative care team will continue to follow. Please call our office at [phone] if we can be of additional assistance.   Jason Coop, NP , DNP, MPH, AGPCNP-BC, ACHPN  COVID-19 PATIENT SCREENING TOOL Asked and negative response unless otherwise noted:   Have you had symptoms of covid, tested positive or been in contact with someone with symptoms/positive test in the past 5-10 days?

## 2021-04-01 ENCOUNTER — Other Ambulatory Visit: Payer: Self-pay

## 2021-04-01 ENCOUNTER — Non-Acute Institutional Stay: Payer: Medicare Other | Admitting: Primary Care

## 2021-04-01 DIAGNOSIS — G309 Alzheimer's disease, unspecified: Secondary | ICD-10-CM

## 2021-04-01 DIAGNOSIS — F0150 Vascular dementia without behavioral disturbance: Secondary | ICD-10-CM

## 2021-04-01 DIAGNOSIS — G7000 Myasthenia gravis without (acute) exacerbation: Secondary | ICD-10-CM

## 2021-04-01 DIAGNOSIS — Z515 Encounter for palliative care: Secondary | ICD-10-CM

## 2021-04-01 NOTE — Progress Notes (Addendum)
[facility] Pall[facility] Consult Note Telephone: [phone]  Fax: [phone]    Date of encounter: 04/01/21 PATIENT NAME: Robin Archer [place] [address]   [phone] (home) [phone] (work) DOB: 1946-04-02 MRN: [id number] PRIMARY CARE PROVIDER:    Alvester Morin, MD,  [place]. [place] [address] [phone]  REFERRING PROVIDER:   Alvester Morin, MD [address]. [place],  [place] [address] [phone]  RESPONSIBLE PARTY:    Contact Information     Name Relation Home Work Mobile   Robin Archer Daughter   [phone]   Debbrah Alar  [phone]        I met face to face with patient in [facility]. Palliative Care was asked to follow this patient by consultation request of  Slade-Hartman, Ivette Loyal* to address advance care planning and complex medical decision making. This is a follow up visit.                                   ASSESSMENT AND PLAN / RECOMMENDATIONS:   Advance Care Planning/Goals of Care: Goals include to maximize quality of life and symptom management.  CODE STATUS: DNR  Symptom Management/Plan:   I met with patient in her SNF room. Staff state she is at her baseline, cooperative and eating well. She must be positioned sitting for a while after her meal. She is able to smile and answer yes/ no questions by the shake of her head. She has gained some weight but appears to range between 94 and 104 lbs consistently. She denied pain or other discomforts.  Follow up Palliative Care Visit: Palliative care will continue to follow for complex medical decision making, advance care planning, and clarification of goals. Return 8 weeks or prn.  I spent 25 minutes providing this consultation. More than 50% of the time in this consultation was spent in counseling and care coordination.   PPS: 30%  HOSPICE ELIGIBILITY/DIAGNOSIS: TBD  Chief  Complaint: debility  HISTORY OF PRESENT ILLNESS:  Robin Archer is a 75 y.o. year old female  with Dementia, MG, debility, aphasia.   History obtained from review of EMR, discussion with primary team, and interview with family, facility staff/caregiver and/or Ms. Robin Archer.  I reviewed available labs, medications, imaging, studies and related documents from the EMR.  Records reviewed and summarized above.   ROS/staff   General: NAD ENMT: occ dysphagia Pulmonary: denies cough, denies increased SOB Abdomen: endorses good appetite, denies constipation, endorses incontinence of bowel GU: denies dysuria, endorses incontinence of urine MSK:  endorses  weakness,  + falls reported Skin: denies rashes or wounds Neurological: denies pain, denies insomnia Psych: Endorses positive mood Heme/lymph/immuno: denies bruises, abnormal bleeding  Physical Exam: Current and past weights: 101 lbs Constitutional: NAD General: frail appearing, thin EYES: anicteric sclera, lids intact, no discharge  ENMT: intact hearing, oral mucous membranes moist CV: S1S2, RRR, no LE edema Pulmonary: LCTA, no increased work of breathing, no cough, room air Abdomen: intake 75%, normo-active BS + 4 quadrants, soft and non tender, no ascites GU: deferred MSK: mod sarcopenia, LE contractures, non ambulatory Skin: warm and dry, no rashes or wounds on visible skin Neuro:  ++ generalized weakness,  ++ cognitive impairment Psych: non-anxious affect, A and O x 1 Hem/lymph/immuno: no widespread bruising  Outpatient Encounter Medications as of 04/01/2021  Medication Sig   hydroxypropyl methylcellulose / hypromellose (ISOPTO  TEARS / GONIOVISC) 2.5 % ophthalmic solution Place 2 drops into both eyes 4 (four) times daily.   insulin detemir (LEVEMIR) 100 UNIT/ML injection Inject 8 Units into the skin at bedtime. Hold for BG < 100   insulin detemir (LEVEMIR) 100 UNIT/ML injection Inject 22 Units into the skin daily. In the AM, hold  for BG < 100   insulin regular (NOVOLIN R) 100 units/mL injection Inject 10 Units into the skin 2 (two) times daily as needed for high blood sugar (BG > 350 mg/ dl). < 60 call MD [phone] give 2 units 201 - 250 give 4 units 251-300 give 6 units 301-350 give 8 units  >350 give 10 units and call MD   metoprolol tartrate (LOPRESSOR) 50 MG tablet Take 50 mg by mouth 2 (two) times daily.   pantoprazole (PROTONIX) 40 MG tablet Take 40 mg by mouth daily.   polyethylene glycol (MIRALAX / GLYCOLAX) packet Take 17 g by mouth 2 (two) times daily.   [DISCONTINUED] atorvastatin (LIPITOR) 10 MG tablet Take 10 mg by mouth daily.   [DISCONTINUED] bisacodyl (DULCOLAX) 10 MG suppository Place 10 mg rectally 4 (four) times a week. Insert 1 pr at bedtime on Sunday, Monday, Wednesday, Friday.   [DISCONTINUED] ferrous sulfate 220 (44 Fe) MG/5ML solution Take 220 mg by mouth daily.   [DISCONTINUED] Omega-3 Fatty Acids (FISH OIL) 1000 MG CAPS Take 1 capsule by mouth daily.   [DISCONTINUED] ondansetron (ZOFRAN) 4 MG tablet Take 4 mg by mouth every 6 (six) hours as needed for nausea or vomiting.   No facility-administered encounter medications on file as of 04/01/2021.    Thank you for the opportunity to participate in the care of Ms. Robin Archer.  The palliative care team will continue to follow. Please call our office at [phone] if we can be of additional assistance.   Jason Coop, NP   COVID-19 PATIENT SCREENING TOOL Asked and negative response unless otherwise noted:   Have you had symptoms of covid, tested positive or been in contact with someone with symptoms/positive test in the past 5-10 days?

## 2021-06-30 ENCOUNTER — Non-Acute Institutional Stay: Payer: Medicare Other | Admitting: Primary Care

## 2021-06-30 ENCOUNTER — Other Ambulatory Visit: Payer: Self-pay

## 2021-06-30 VITALS — Ht 68.0 in | Wt 103.0 lb

## 2021-06-30 DIAGNOSIS — F0280 Dementia in other diseases classified elsewhere without behavioral disturbance: Secondary | ICD-10-CM

## 2021-06-30 DIAGNOSIS — G7000 Myasthenia gravis without (acute) exacerbation: Secondary | ICD-10-CM

## 2021-06-30 DIAGNOSIS — Z515 Encounter for palliative care: Secondary | ICD-10-CM

## 2021-06-30 DIAGNOSIS — F0150 Vascular dementia without behavioral disturbance: Secondary | ICD-10-CM

## 2021-06-30 NOTE — Progress Notes (Signed)
"}   [facility] Palliative Care Consult Note Telephone: [phone]  Fax: [phone]    Date of encounter: 06/30/21 4:23 PM PATIENT NAME: Robin [place] [address]   [phone] (home) [phone] (work) DOB: November 03, 1945 MRN: [id number] PRIMARY CARE PROVIDER:    Alvester Morin, MD,  [place]. [place] [address] [phone]  REFERRING PROVIDER:   Alvester Morin, MD [address]. [place],  [place] [address] [phone]  RESPONSIBLE PARTY:    Contact Information     Name Relation Home Work Mobile   Stutts,[name] Daughter   [phone]   Debbrah Alar  [phone]         I met face to face with patient in [facility] facility. Palliative Care was asked to follow this patient by consultation request of  Slade-Hartman, Ivette Loyal* to address advance care planning and complex medical decision making. This is a follow up visit.                                   ASSESSMENT AND PLAN / RECOMMENDATIONS:   Advance Care Planning/Goals of Care: Goals include to maximize quality of life and symptom management.  CODE STATUS: DnR  Symptom Management/Plan:  Nutrition: Losing weight, recommend supplements. She does have feeding per staff. Recommend higher calorie supplements, magic cup. Last A1C=7.9.  Constipation: Resident endorses constipation which is borne out on record. Recommend adding 17 g miralax to liquids or senna 1 tab bid. She does not have a standing bowel program currently.  Follow up Palliative Care Visit: Palliative care will continue to follow for complex medical decision making, advance care planning, and clarification of goals. Return 12 weeks or prn.  I spent 25 minutes providing this consultation. More than 50% of the time in this consultation was spent in counseling and care coordination.  PPS: 30%  HOSPICE ELIGIBILITY/DIAGNOSIS: no  Chief Complaint:  wt loss, constipation  HISTORY OF PRESENT ILLNESS:  Robin Archer is a 75 y.o. year old female  with MG, dementia, DM, weight loss.  History obtained from review of EMR, discussion with primary team, and interview with family, facility staff/caregiver and/or Robin Archer.  I reviewed available labs, medications, imaging, studies and related documents from the EMR.  Records reviewed and summarized above.   ROS [facility]  General: NAD ENMT: endorses mild  dysphagia Pulmonary: denies cough, denies increased SOB Abdomen: endorses good appetite, endorses  constipation, endorses incontinence of bowel GU: denies dysuria, endorses incontinence of urine MSK:  endorses weakness,  no falls reported Skin: denies rashes or wounds Neurological: denies pain, denies insomnia Psych: Endorses positive mood, smiling but non verbal Heme/lymph/immuno: denies bruises, abnormal bleeding  Physical Exam: Current and past weights: 103 lbs, 4 lb wt loss in 4 months.Body mass index is 15.66 kg/m. Constitutional: NAD General: frail appearing, thin EYES: anicteric sclera, lids intact, no discharge  ENMT: intact hearing, oral mucous membranes moist, dentition intact CV: no LE edema Pulmonary: no increased work of breathing, no cough, room air Abdomen: intake 50%, no ascites MSK: + sarcopenia, Contractures all limbs, non-ambulatory Skin: warm and dry, no rashes or wounds on visible skin Neuro:  ++ generalized weakness,  ++ cognitive impairment Psych: non-anxious affect, A and O x 2 Hem/lymph/immuno: no widespread bruising   Thank you for the opportunity to participate in the care of Robin Archer.  The palliative care team will continue to  follow. Please call our office at [phone] if we can be of additional assistance.   Jason Coop, NP DNP, AGPCNP-BC  COVID-19 PATIENT SCREENING TOOL Asked and negative response unless otherwise noted:   Have you had symptoms of covid, tested positive or been in  contact with someone with symptoms/positive test in the past 5-10 days?

## 2021-07-25 ENCOUNTER — Encounter: Payer: Self-pay | Admitting: Emergency Medicine

## 2021-07-25 ENCOUNTER — Other Ambulatory Visit: Payer: Self-pay

## 2021-07-25 ENCOUNTER — Emergency Department: Payer: Medicare Other

## 2021-07-25 ENCOUNTER — Inpatient Hospital Stay
Admission: EM | Admit: 2021-07-25 | Discharge: 2021-08-02 | DRG: 871 | Disposition: A | Discharge status: Skilled Nursing Facility | Payer: Medicare Other | Source: Skilled Nursing Facility | Attending: Internal Medicine | Admitting: Internal Medicine

## 2021-07-25 DIAGNOSIS — R0602 Shortness of breath: Secondary | ICD-10-CM

## 2021-07-25 DIAGNOSIS — J690 Pneumonitis due to inhalation of food and vomit: Secondary | ICD-10-CM | POA: Diagnosis present

## 2021-07-25 DIAGNOSIS — R64 Cachexia: Secondary | ICD-10-CM | POA: Diagnosis present

## 2021-07-25 DIAGNOSIS — E785 Hyperlipidemia, unspecified: Secondary | ICD-10-CM | POA: Diagnosis present

## 2021-07-25 DIAGNOSIS — E539 Vitamin B deficiency, unspecified: Secondary | ICD-10-CM | POA: Diagnosis present

## 2021-07-25 DIAGNOSIS — Z66 Do not resuscitate: Secondary | ICD-10-CM | POA: Diagnosis not present

## 2021-07-25 DIAGNOSIS — A419 Sepsis, unspecified organism: Secondary | ICD-10-CM | POA: Diagnosis not present

## 2021-07-25 DIAGNOSIS — R569 Unspecified convulsions: Secondary | ICD-10-CM | POA: Diagnosis present

## 2021-07-25 DIAGNOSIS — J189 Pneumonia, unspecified organism: Secondary | ICD-10-CM

## 2021-07-25 DIAGNOSIS — Z20822 Contact with and (suspected) exposure to covid-19: Secondary | ICD-10-CM | POA: Diagnosis present

## 2021-07-25 DIAGNOSIS — F01C0 Vascular dementia, severe, without behavioral disturbance, psychotic disturbance, mood disturbance, and anxiety: Secondary | ICD-10-CM | POA: Diagnosis present

## 2021-07-25 DIAGNOSIS — G7000 Myasthenia gravis without (acute) exacerbation: Secondary | ICD-10-CM | POA: Diagnosis present

## 2021-07-25 DIAGNOSIS — K219 Gastro-esophageal reflux disease without esophagitis: Secondary | ICD-10-CM | POA: Diagnosis present

## 2021-07-25 DIAGNOSIS — F02C0 Dementia in other diseases classified elsewhere, severe, without behavioral disturbance, psychotic disturbance, mood disturbance, and anxiety: Secondary | ICD-10-CM | POA: Diagnosis present

## 2021-07-25 DIAGNOSIS — Z515 Encounter for palliative care: Secondary | ICD-10-CM

## 2021-07-25 DIAGNOSIS — I10 Essential (primary) hypertension: Secondary | ICD-10-CM | POA: Diagnosis present

## 2021-07-25 DIAGNOSIS — R1310 Dysphagia, unspecified: Secondary | ICD-10-CM | POA: Diagnosis present

## 2021-07-25 DIAGNOSIS — G9341 Metabolic encephalopathy: Secondary | ICD-10-CM | POA: Diagnosis present

## 2021-07-25 DIAGNOSIS — T17908D Unspecified foreign body in respiratory tract, part unspecified causing other injury, subsequent encounter: Secondary | ICD-10-CM

## 2021-07-25 DIAGNOSIS — R824 Acetonuria: Secondary | ICD-10-CM | POA: Diagnosis present

## 2021-07-25 DIAGNOSIS — I248 Other forms of acute ischemic heart disease: Secondary | ICD-10-CM | POA: Diagnosis not present

## 2021-07-25 DIAGNOSIS — Z7401 Bed confinement status: Secondary | ICD-10-CM

## 2021-07-25 DIAGNOSIS — E119 Type 2 diabetes mellitus without complications: Secondary | ICD-10-CM | POA: Diagnosis present

## 2021-07-25 DIAGNOSIS — E876 Hypokalemia: Secondary | ICD-10-CM | POA: Diagnosis not present

## 2021-07-25 DIAGNOSIS — E43 Unspecified severe protein-calorie malnutrition: Secondary | ICD-10-CM | POA: Diagnosis present

## 2021-07-25 DIAGNOSIS — R4182 Altered mental status, unspecified: Secondary | ICD-10-CM | POA: Diagnosis not present

## 2021-07-25 DIAGNOSIS — R627 Adult failure to thrive: Secondary | ICD-10-CM | POA: Diagnosis present

## 2021-07-25 DIAGNOSIS — Z681 Body mass index (BMI) 19 or less, adult: Secondary | ICD-10-CM

## 2021-07-25 DIAGNOSIS — Z79899 Other long term (current) drug therapy: Secondary | ICD-10-CM

## 2021-07-25 DIAGNOSIS — G309 Alzheimer's disease, unspecified: Secondary | ICD-10-CM | POA: Diagnosis present

## 2021-07-25 DIAGNOSIS — Z794 Long term (current) use of insulin: Secondary | ICD-10-CM

## 2021-07-25 LAB — MAGNESIUM: Magnesium: 1.6 mg/dL — ABNORMAL LOW (ref 1.7–2.4)

## 2021-07-25 LAB — COMPREHENSIVE METABOLIC PANEL
ALT: 11 U/L (ref 0–44)
AST: 20 U/L (ref 15–41)
Albumin: 4.1 g/dL (ref 3.5–5.0)
Alkaline Phosphatase: 64 U/L (ref 38–126)
Anion gap: 13 (ref 5–15)
BUN: 19 mg/dL (ref 8–23)
CO2: 25 mmol/L (ref 22–32)
Calcium: 9.7 mg/dL (ref 8.9–10.3)
Chloride: 105 mmol/L (ref 98–111)
Creatinine, Ser: 0.71 mg/dL (ref 0.44–1.00)
GFR, Estimated: >60 mL/min (ref >60)
Glucose, Bld: 201 mg/dL — ABNORMAL HIGH (ref 70–99)
Potassium: 3.2 mmol/L — ABNORMAL LOW (ref 3.5–5.1)
Sodium: 143 mmol/L (ref 135–145)
Total Bilirubin: 0.6 mg/dL (ref 0.3–1.2)
Total Protein: 7.4 g/dL (ref 6.5–8.1)

## 2021-07-25 LAB — CBC WITH DIFFERENTIAL/PLATELET
Abs Immature Granulocytes: 0.02 10*3/uL (ref 0.00–0.07)
Basophils Absolute: 0 10*3/uL (ref 0.0–0.1)
Basophils Relative: 1 %
Eosinophils Absolute: 0 10*3/uL (ref 0.0–0.5)
Eosinophils Relative: 1 %
HCT: 34.2 % — ABNORMAL LOW (ref 36.0–46.0)
Hemoglobin: 11.2 g/dL — ABNORMAL LOW (ref 12.0–15.0)
Immature Granulocytes: 0 %
Lymphocytes Relative: 13 %
Lymphs Abs: 0.8 10*3/uL (ref 0.7–4.0)
MCH: 27.5 pg (ref 26.0–34.0)
MCHC: 32.7 g/dL (ref 30.0–36.0)
MCV: 84 fL (ref 80.0–100.0)
Monocytes Absolute: 0.5 10*3/uL (ref 0.1–1.0)
Monocytes Relative: 8 %
Neutro Abs: 5.2 10*3/uL (ref 1.7–7.7)
Neutrophils Relative %: 77 %
Platelets: 192 10*3/uL (ref 150–400)
RBC: 4.07 MIL/uL (ref 3.87–5.11)
RDW: 13.5 % (ref 11.5–15.5)
WBC: 6.6 10*3/uL (ref 4.0–10.5)
nRBC: 0 % (ref 0.0–0.2)

## 2021-07-25 LAB — URINALYSIS, ROUTINE W REFLEX MICROSCOPIC
Bilirubin Urine: NEGATIVE
Glucose, UA: NEGATIVE mg/dL
Hgb urine dipstick: NEGATIVE
Ketones, ur: 5 mg/dL — AB
Leukocytes,Ua: NEGATIVE
Nitrite: NEGATIVE
Protein, ur: NEGATIVE mg/dL
Specific Gravity, Urine: 1.008 (ref 1.005–1.030)
pH: 7 (ref 5.0–8.0)

## 2021-07-25 LAB — TSH: TSH: 1.448 u[IU]/mL (ref 0.350–4.500)

## 2021-07-25 LAB — CK: Total CK: 23 U/L — ABNORMAL LOW (ref 38–234)

## 2021-07-25 MED ORDER — MAGNESIUM SULFATE 2 GM/50ML IV SOLN
2.0000 g | Freq: Once | INTRAVENOUS | Status: AC
  Administered 2021-07-26: 2 g via INTRAVENOUS
  Filled 2021-07-25: qty 50

## 2021-07-25 MED ORDER — LORAZEPAM 2 MG/ML IJ SOLN
INTRAMUSCULAR | Status: AC
  Administered 2021-07-25: 1 mg via INTRAVENOUS
  Filled 2021-07-25: qty 1

## 2021-07-25 MED ORDER — METOPROLOL TARTRATE 5 MG/5ML IV SOLN
2.5000 mg | Freq: Once | INTRAVENOUS | Status: DC
  Filled 2021-07-25: qty 5

## 2021-07-25 MED ORDER — IOHEXOL 350 MG/ML SOLN
75.0000 mL | Freq: Once | INTRAVENOUS | Status: AC | PRN
  Administered 2021-07-25: 75 mL via INTRAVENOUS

## 2021-07-25 MED ORDER — SODIUM CHLORIDE 0.9 % IV BOLUS: 500.0000 mL | Freq: Once | INTRAVENOUS | Status: DC

## 2021-07-25 MED ORDER — LEVETIRACETAM IN NACL 1500 MG/100ML IV SOLN
1500.0000 mg | Freq: Once | INTRAVENOUS | Status: AC
  Administered 2021-07-25: 1500 mg via INTRAVENOUS
  Filled 2021-07-25: qty 100

## 2021-07-25 MED ORDER — METOPROLOL TARTRATE 5 MG/5ML IV SOLN
2.5000 mg | Freq: Once | INTRAVENOUS | Status: AC
  Administered 2021-07-25: 2.5 mg via INTRAVENOUS
  Filled 2021-07-25: qty 5

## 2021-07-25 MED ORDER — LORAZEPAM 2 MG/ML IJ SOLN: 1.0000 mg | Freq: Once | INTRAMUSCULAR | Status: AC

## 2021-07-25 MED ORDER — SODIUM CHLORIDE 0.9 % IV BOLUS
1000.0000 mL | Freq: Once | INTRAVENOUS | Status: AC
  Administered 2021-07-25: 1000 mL via INTRAVENOUS

## 2021-07-25 NOTE — ED Triage Notes (Signed)
Pt presents to ED via [facility] from [facility]. EMS states pt BP was 180/90.

## 2021-07-25 NOTE — ED Notes (Signed)
Cath U/A obtained using sterile technique, approx 38ml's clear, yellow urine obtained, labeled, and sent to the lab. Pt tolerated fair.

## 2021-07-25 NOTE — H&P (Signed)
History and Physical    Mahaila Flanigan RVU:[id number] DOB: 1946-05-06 DOA: 07/25/2021  PCP: Keane Police, MD    Patient coming from:  SNF   Chief Complaint:  AMS/ seizures   HPI:  Genora Claycamp is a 75 y.o. female seen in ed with complaints of status and facial twitching with concerns of seizure.  Patient at baseline is bedbound and is nonverbal.  Has spontaneous eye opening and speech random but is noncommunicative for the most part.  Per report patient has been not at her baseline.  And patient found to be extremely tachycardic and therefore brought to the emergency room with her most forms in place.  Pt has past medical history of Alzheimer's, dementia, gravis, spinal stenosis, hypertension, diabetes mellitus type 2, GERD, tachycardia.   ED Course: Patient moans to sternal rub otherwise GCS of 3. Vitals:   07/25/21 2115 07/25/21 2116 07/26/21 0000 07/26/21 0100  BP: (!) 194/99  (!) 164/87 (!) 162/93  Pulse: (!) 122  (!) 107 (!) 105  Resp: 11  17 14   Temp:  (S) 98.7 F (37.1 C)    TempSrc:  (S) Rectal    SpO2: 97%  98% 98%  Weight:      Vitals show tachycardia and hypertension, patient is contracted and on her left side. Blood work shows potassium 3.2, magnesium of 1.6, urine is clear, hemoglobin is 11.2. Initial EKG shows sinus tachycardia with a heart rate of 140s with no ST-T wave changes.  Review of Systems:  Review of Systems  Unable to perform ROS: Dementia    Past Medical History:  Diagnosis Date   DM (diabetes mellitus) (HCC)    GERD (gastroesophageal reflux disease)    Hyperlipidemia    Hypertension    Memory loss    Myasthenia gravis (HCC)    Spinal stenosis    Vitamin B deficiency     Past Surgical History:  Procedure Laterality Date   CATARACT EXTRACTION       reports that she has never smoked. She does not have any smokeless tobacco history on file. She reports that she does not drink alcohol and does not use drugs.  No Known  Allergies  History reviewed. No pertinent family history.  Prior to Admission medications   Medication Sig Start Date End Date Taking? Authorizing Provider  HUMALOG KWIKPEN 100 UNIT/ML KwikPen Inject into the skin. Sliding scale 06/11/21  Yes [provider]  insulin detemir (LEVEMIR) 100 UNIT/ML injection Inject 8 Units into the skin at bedtime. Hold for BG < 100   Yes [provider]  insulin detemir (LEVEMIR) 100 UNIT/ML injection Inject 22 Units into the skin daily. In the AM, hold for BG < 100   Yes [provider]  metoprolol tartrate (LOPRESSOR) 50 MG tablet Take 50 mg by mouth 2 (two) times daily.   Yes [provider]  hydroxypropyl methylcellulose / hypromellose (ISOPTO TEARS / GONIOVISC) 2.5 % ophthalmic solution Place 2 drops into both eyes 4 (four) times daily. Patient not taking: No sig reported    [provider]  insulin regular (NOVOLIN R) 100 units/mL injection Inject 10 Units into the skin 2 (two) times daily as needed for high blood sugar (BG > 350 mg/ dl). < 60 call MD 161-200 give 2 units 201 - 250 give 4 units 251-300 give 6 units 301-350 give 8 units  >350 give 10 units and call MD Patient not taking: No sig reported    [provider]  pantoprazole (PROTONIX) 40 MG tablet Take 40 mg by mouth daily. Patient not taking: No sig reported    [provider]  polyethylene glycol (MIRALAX / GLYCOLAX) packet Take 17 g by mouth 2 (two) times daily. Patient not taking: Reported on 06/30/2021    [provider]    Physical Exam: Vitals:   07/25/21 22-Jan-2114 07/25/21 2116 07/26/21 0000 07/26/21 0100  BP: (!) 194/99  (!) 164/87 (!) 162/93  Pulse: (!) 122  (!) 107 (!) 105  Resp: 11  17 14   Temp:  (S) 98.7 F (37.1 C)    TempSrc:  (S) Rectal    SpO2: 97%  98% 98%  Weight:       Physical Exam Vitals reviewed.  Constitutional:      Appearance: Normal appearance.  HENT:     Head: Normocephalic and  atraumatic.     Mouth/Throat:     Mouth: Mucous membranes are dry.  Eyes:     Pupils: Pupils are equal, round, and reactive to light.  Cardiovascular:     Rate and Rhythm: Normal rate and regular rhythm.     Pulses: Normal pulses.     Heart sounds: Normal heart sounds.  Pulmonary:     Effort: Pulmonary effort is normal.     Breath sounds: Normal breath sounds.  Abdominal:     General: There is no distension.     Palpations: Abdomen is soft. There is no mass.     Tenderness: There is no guarding.     Hernia: No hernia is present.  Musculoskeletal:     Right lower leg: No edema.     Left lower leg: No edema.  Neurological:     Mental Status: She is alert.     Motor: Weakness present.     Comments: Unable to assess cranial nerves, from no facial droop, pupils equally round and reactive to light.  Patient is contracted and is flexed with her knees and arms.     Labs on Admission: I have personally reviewed following labs and imaging studies  Recent Labs    07/25/21 22-Jan-2046  CKTOTAL 23*   Lab Results  Component Value Date   WBC 6.6 07/25/2021   HGB 11.2 (L) 07/25/2021   HCT 34.2 (L) 07/25/2021   MCV 84.0 07/25/2021   PLT 192 07/25/2021    Recent Labs  Lab 07/25/21 1826  NA 143  K 3.2*  CL 105  CO2 25  BUN 19  CREATININE 0.71  CALCIUM 9.7  PROT 7.4  BILITOT 0.6  ALKPHOS 64  ALT 11  AST 20  GLUCOSE 201*   No results found for: CHOL, HDL, LDLCALC, TRIG No results found for: DDIMER Invalid input(s): POCBNP   COVID-19 Labs No results for input(s): DDIMER, FERRITIN, LDH, CRP in the last 72 hours. No results found for: SARSCOV2NAA  Radiological Exams on Admission: CT HEAD WO CONTRAST (07/27/21)  Result Date: 07/25/2021 CLINICAL DATA:  Seizure. EXAM: CT HEAD WITHOUT CONTRAST TECHNIQUE: Contiguous axial images were obtained from the base of the skull through the vertex without intravenous contrast. COMPARISON:  Head CT dated 11/10/2016. FINDINGS: Brain: Moderate  age-related atrophy and chronic microvascular ischemic changes. Interval increase in the ex vacuo dilatation of the left lateral ventricle. There is no acute intracranial hemorrhage. No mass effect or midline shift. No extra-axial fluid collection. Vascular: No hyperdense vessel or unexpected calcification. Skull: Normal. Negative for fracture or focal lesion. Sinuses/Orbits: No acute finding. Other: None IMPRESSION: 1. No acute intracranial  pathology. 2. Moderate age-related atrophy and chronic microvascular ischemic changes. Electronically Signed   By: Elgie Collard M.D.   On: 07/25/2021 19:24   CT Angio Chest Pulmonary Embolism (PE) W or WO Contrast  Result Date: 07/25/2021 CLINICAL DATA:  Concern for pulmonary embolism. EXAM: CT ANGIOGRAPHY CHEST WITH CONTRAST TECHNIQUE: Multidetector CT imaging of the chest was performed using the standard protocol during bolus administration of intravenous contrast. Multiplanar CT image reconstructions and MIPs were obtained to evaluate the vascular anatomy. CONTRAST:  21mL OMNIPAQUE IOHEXOL 350 MG/ML SOLN COMPARISON:  Chest radiograph dated 07/25/2021. chest CT dated 04/15/2015. FINDINGS: Evaluation of this exam is limited due to respiratory motion artifact. Cardiovascular: There is no cardiomegaly. Small pericardial effusion measuring 9 mm in thickness anterior to the heart. There is coronary vascular calcification. The thoracic aorta is unremarkable. The origins of the great vessels of the aortic arch appear patent. Evaluation of the pulmonary arteries is very limited due to respiratory motion artifact and suboptimal opacification and timing of the contrast. No definite large or central pulmonary artery embolus identified. Mediastinum/Nodes: No hilar or mediastinal adenopathy. The esophagus is grossly unremarkable. No mediastinal fluid collection. Lungs/Pleura: Diffuse hazy and streaky density throughout the left lung may be related to atelectasis. Atypical  infection is not excluded. Clinical correlation is recommended. No lobar consolidation, pleural effusion, or pneumothorax. The central airways are patent. Upper Abdomen: Small scattered calcified liver granuloma. Musculoskeletal: Degenerative changes of the spine and osteopenia. No acute osseous pathology. Review of the MIP images confirms the above findings. IMPRESSION: 1. No large central pulmonary artery embolus. 2. Diffuse hazy and streaky density throughout the left lung may be related to atelectasis. Atypical infection is not excluded. 3. Small pericardial effusion. 4. Aortic Atherosclerosis (ICD10-I70.0). Electronically Signed   By: Elgie Collard M.D.   On: 07/25/2021 23:20   DG Chest Portable 1 View  Result Date: 07/25/2021 CLINICAL DATA:  Seizure-like activity.  Altered mental status EXAM: PORTABLE CHEST 1 VIEW.  Patient is rotated. COMPARISON:  Chest x-ray 04/15/2015 FINDINGS: The heart and mediastinal contours are unchanged. No focal consolidation. No pulmonary edema. No pleural effusion. No pneumothorax. No acute osseous abnormality. Likely old healed right proximal humeral fracture. IMPRESSION: No active disease. Electronically Signed   By: Tish Frederickson M.D.   On: 07/25/2021 19:15    EKG: Independently reviewed.  Sinus tach 140 with significant Q waves in  leads III  Assessment/Plan: Patient is a 75 year old brought from nursing home for shaking and suspected seizures. Principal Problem:   Altered mental state Active Problems:   Seizures (HCC)   Pneumonia   Myasthenia gravis (HCC)   Type II diabetes mellitus (HCC)   HTN (hypertension)   GERD (gastroesophageal reflux disease) Altered mental status/seizures: Although patient is nonverbal at baseline due to her dementia. She is not alert as er usual self per report.  Neuro check. /aspiration/  fall precaution.  EEG tonight.   Pneumonia: On CT angio of the chest, left lung consolidation concerning for infection.  We will  start pt on    MG: PT consult.  No medications.   Dm II: SSI. Glycemic protocol.  HTN: Blood pressure (!) 162/93, pulse (!) 105, temperature (S) 98.7 F (37.1 C), temperature source (S) Rectal, resp. rate 14, weight 46 kg, SpO2 98 %. Patient was given metoprolol for heart rate and hypertension.  DVT prophylaxis:  Heparin  Code Status:  Heparin  Family Communication:  Genevie Ann (Daughter)  [phone] (Mobile)   Disposition Plan:  Skilled  nursing facility  Consults called:  None   Admission status: Inpatient.     Gertha Calkin MD [facility] [phone] How to contact the [facility] Attending or Consulting provider 7A - 7P or covering provider during after hours 7P -7A, for this patient.    Check the care team in [facility] and look for a) attending/consulting [facility] provider listed and b) the [facility] team listed Log into [email or web address] and use [place]'s universal password to access. If you do not have the password, please contact the hospital operator. Locate the [facility] provider you are looking for under [facility] and page to a number that you can be directly reached. If you still have difficulty reaching the provider, please page the [facility] (Director on Call) for the Hospitalists listed on amion for assistance. [email or web address] Password [email or web address] 07/26/2021, 1:22 AM

## 2021-07-25 NOTE — ED Notes (Signed)
Patient transported to CT at this time. 

## 2021-07-25 NOTE — ED Notes (Signed)
X-ray at bedside

## 2021-07-25 NOTE — ED Provider Notes (Signed)
[facility] Emergency Department Provider Note  ____________________________________________   Event Date/Time   First MD Initiated Contact with Patient 07/25/21 1809     (approximate)  I have reviewed the triage vital signs and the nursing notes.   HISTORY  Chief Complaint Hypertension    HPI Robin Archer is a 75 y.o. female with history of dementia, diabetes, hypertension, hyperlipidemia, myasthenia, here with facial twitching.  Per report, patient has been more "out of it" today and was noted to have facial twitching/drooping earlier today.  Unclear what her last normal was.  She subsequently sent to the ED for evaluation.  With EMS, she has been tachycardic and minimally responsive.  She is demented at baseline.  She arrives with a MOST form in place.  She is unable to provide any additional history and no family present on arrival.  Level 5 caveat invoked as remainder of history, ROS, and physical exam limited due to patient's mental status/dementia.     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 Authorizing  Provider  HUMALOG KWIKPEN 100 UNIT/ML KwikPen Inject into the skin. Sliding scale 06/11/21  Yes [provider]  insulin detemir (LEVEMIR) 100 UNIT/ML injection Inject 8 Units into the skin at bedtime. Hold for BG < 100   Yes [provider]  insulin detemir (LEVEMIR) 100 UNIT/ML injection Inject 22 Units into the skin daily. In the AM, hold for BG < 100   Yes [provider]  metoprolol tartrate (LOPRESSOR) 50 MG tablet Take 50 mg by mouth 2 (two) times daily.   Yes [provider]  hydroxypropyl methylcellulose / hypromellose (ISOPTO TEARS / GONIOVISC) 2.5 % ophthalmic solution Place 2 drops into both eyes 4 (four) times daily. Patient not taking: No sig reported    [provider]  insulin regular (NOVOLIN R) 100 units/mL injection Inject 10 Units into the skin 2 (two) times daily as needed for high blood sugar (BG > 350 mg/ dl). < 60 call MD 161-200 give 2 units 201 - 250 give 4 units 251-300 give 6 units 301-350 give 8 units  >350 give 10 units and call MD Patient not taking: No sig reported    [provider]  pantoprazole (PROTONIX) 40 MG tablet Take 40 mg by mouth daily. Patient not taking: No sig reported    [provider]  polyethylene glycol (MIRALAX / GLYCOLAX) packet Take 17 g by mouth 2 (two) times daily. Patient not taking: Reported on 06/30/2021    [provider]    Allergies Patient has no known allergies.  No family history on file.  Social History Social History   Tobacco Use   Smoking status: Never  Substance Use Topics   Alcohol use: No    Alcohol/week: 0.0 standard drinks   Drug use: No    Review of Systems  Review of Systems  Unable to perform ROS: Mental status change    ____________________________________________  PHYSICAL EXAM:      VITAL SIGNS: ED Triage Vitals [07/25/21 1831]  Enc Vitals Group     BP (!) 186/99     Pulse Rate (!) 144     Resp 16     Temp 98.3 F  (36.8 C)     Temp Source Axillary     SpO2 98 %     Weight 101 lb 6.6 oz (46 kg)     Height      Head Circumference      Peak Flow      Pain Score      Pain Loc      Pain Edu?      Excl. in GC?      Physical Exam Vitals and nursing note reviewed.  Constitutional:      General: She is not in acute distress.    Appearance: She is well-developed.  HENT:     Head: Normocephalic and atraumatic.     Mouth/Throat:     Mouth: Mucous membranes are dry.  Eyes:     Conjunctiva/sclera: Conjunctivae normal.  Cardiovascular:     Rate and Rhythm: Regular rhythm. Tachycardia present.     Heart sounds: Normal heart sounds.  Pulmonary:     Effort: Pulmonary effort is normal. No respiratory distress.     Breath sounds: No wheezing.     Comments: Transmitted upper airway sounds Abdominal:     General: There is no distension.  Musculoskeletal:     Cervical back: Neck supple.  Skin:    General: Skin is warm.     Capillary Refill: Capillary refill takes less than 2 seconds.     Findings: No rash.  Neurological:     Mental Status: She is alert.     Motor: No abnormal muscle tone.     Comments: Leftward gaze and chin rotation, clenched jaw. Rhythmic shaking of entire body noted, though not overt clonic behavior. Does not follow commands.      ____________________________________________   LABS (all labs ordered are listed, but only abnormal results are displayed)  Labs Reviewed  COMPREHENSIVE METABOLIC PANEL - Abnormal; Notable for the following components:      Result Value   Potassium 3.2 (*)    Glucose, Bld 201 (*)    All other components within normal limits  MAGNESIUM - Abnormal; Notable for the following components:   Magnesium 1.6 (*)    All other components within normal limits  URINALYSIS, ROUTINE W REFLEX MICROSCOPIC - Abnormal; Notable for the following components:   Color, Urine STRAW (*)    APPearance CLEAR (*)    Ketones, ur 5 (*)    All other components within  normal limits  CBC WITH DIFFERENTIAL/PLATELET - Abnormal; Notable for the following components:   Hemoglobin 11.2 (*)    HCT 34.2 (*)    All other components within normal limits  RESP PANEL BY RT-PCR (FLU A&B, COVID) ARPGX2  CBC WITH DIFFERENTIAL/PLATELET  CK  TSH    ____________________________________________  EKG: Sinus tachycardia, VR 140. PR 138, QRS 89, QTc 419. No acute ST elevation or depressions. No ischemia or  infarct. ________________________________________  RADIOLOGY All imaging, including plain films, CT scans, and ultrasounds, independently reviewed by me, and interpretations confirmed via formal radiology reads.  ED MD interpretation:   CT Head: No acute abnormality Chest x-ray: Clear  Official radiology report(s): CT HEAD WO CONTRAST ( )  Result Date: 07/25/2021 CLINICAL DATA:  Seizure. EXAM: CT HEAD WITHOUT CONTRAST TECHNIQUE: Contiguous axial images were obtained from the base of the skull through the vertex without intravenous contrast. COMPARISON:  Head CT dated 11/10/2016. FINDINGS: Brain: Moderate age-related atrophy and chronic microvascular ischemic changes. Interval increase in the ex vacuo dilatation of the left lateral ventricle. There is no acute intracranial hemorrhage. No mass effect or midline shift. No extra-axial fluid collection. Vascular: No hyperdense vessel or unexpected calcification. Skull: Normal. Negative for fracture or focal lesion. Sinuses/Orbits: No acute finding. Other: None IMPRESSION: 1. No acute intracranial pathology. 2. Moderate age-related atrophy and chronic microvascular ischemic changes. Electronically Signed   By: Elgie Collard M.D.   On: 07/25/2021 19:24   DG Chest Portable 1 View  Result Date: 07/25/2021 CLINICAL DATA:  Seizure-like activity.  Altered mental status EXAM: PORTABLE CHEST 1 VIEW.  Patient is rotated. COMPARISON:  Chest x-ray 04/15/2015 FINDINGS: The heart and mediastinal contours are unchanged. No focal  consolidation. No pulmonary edema. No pleural effusion. No pneumothorax. No acute osseous abnormality. Likely old healed right proximal humeral fracture. IMPRESSION: No active disease. Electronically Signed   By: Tish Frederickson M.D.   On: 07/25/2021 19:15    ____________________________________________  PROCEDURES   Procedure(s) performed (including Critical Care):  .1-3 Lead EKG Interpretation Performed by: Shaune Pollack, MD Authorized by: Shaune Pollack, MD     Interpretation: abnormal     ECG rate:  100-130   ECG rate assessment: tachycardic     Rhythm: sinus tachycardia     Ectopy: none     Conduction: normal   Comments:     Indication: Tachycardia  ____________________________________________  INITIAL IMPRESSION / MDM / ASSESSMENT AND PLAN / ED COURSE  As part of my medical decision making, I reviewed the following data within the electronic MEDICAL RECORD NUMBER Nursing notes reviewed and incorporated, Old chart reviewed, Notes from prior ED visits, and [place] Controlled Substance Database       *Veneta Sliter was evaluated in Emergency Department on 07/25/2021 for the symptoms described in the history of present illness. She was evaluated in the context of the global COVID-19 pandemic, which necessitated consideration that the patient might be at risk for infection with the SARS-CoV-2 virus that causes COVID-19. Institutional protocols and algorithms that pertain to the evaluation of patients at risk for COVID-19 are in a state of rapid change based on information released by regulatory bodies including the CDC and federal and state organizations. These policies and algorithms were followed during the patient's care in the ED.  Some ED evaluations and interventions may be delayed as a result of limited staffing during the pandemic.*     Medical Decision Making: 75 year old female here with reported altered mental status.  There is question of possible facial droop at her  facility, so she was sent for evaluation.  On arrival here, patient was tachycardic, hypertensive, with rhythmic twitching of her face and contraction at the neck.  Patient given Ativan with resolution.  She slowly had improvement in her tachycardia and hypertension but remains less responsive than her baseline per family.  Lab work reviewed, shows no significant leukocytosis.  CMP unremarkable.  Magnesium 1.6, which has been  replaced.  UA with ketonuria, IV fluids been given.  CT head obtained, reviewed, shows no evidence of acute abnormality.  Chest x-ray is clear.  EKG nonischemic.  Suspect new onset seizure like activity.  Stroke is a consideration although less likely given the nature of her presentation.  Will plan to admit, consider initiation of antiepileptics, as well as possible palliative consult.  ____________________________________________  FINAL CLINICAL IMPRESSION(S) / ED DIAGNOSES  Final diagnoses:  Seizure-like activity (HCC)     MEDICATIONS GIVEN DURING THIS VISIT:  Medications  sodium chloride 0.9 % bolus 500 mL (0 mLs Intravenous Hold 07/25/21 2028)  magnesium sulfate IVPB 2 g 50 mL (has no administration in time range)  metoprolol tartrate (LOPRESSOR) injection 2.5 mg (has no administration in time range)  LORazepam (ATIVAN) injection 1 mg (1 mg Intravenous Given 07/25/21 1824)  sodium chloride 0.9 % bolus 1,000 mL (0 mLs Intravenous Stopped 07/25/21 2010)  levETIRAcetam (KEPPRA) IVPB 1500 mg/ 100 mL premix (0 mg Intravenous Stopped 07/25/21 2010)  metoprolol tartrate (LOPRESSOR) injection 2.5 mg (2.5 mg Intravenous Given 07/25/21 2057)     ED Discharge Orders     None        Note:  This document was prepared using Dragon voice recognition software and may include unintentional dictation errors.   Shaune Pollack, MD 07/25/21 [phone]

## 2021-07-25 NOTE — ED Notes (Signed)
Patient had seizure like activity while this RN was collecting blood work. Issacs, MD to bedside. Verbal order for Ativan and given to patient.

## 2021-07-25 NOTE — ED Notes (Signed)
Report received from Lindsay, RN

## 2021-07-25 NOTE — ED Notes (Signed)
Lab at the bedside 

## 2021-07-26 ENCOUNTER — Encounter: Payer: Self-pay | Admitting: Internal Medicine

## 2021-07-26 DIAGNOSIS — R569 Unspecified convulsions: Secondary | ICD-10-CM

## 2021-07-26 DIAGNOSIS — Z20822 Contact with and (suspected) exposure to covid-19: Secondary | ICD-10-CM | POA: Diagnosis present

## 2021-07-26 DIAGNOSIS — J189 Pneumonia, unspecified organism: Secondary | ICD-10-CM

## 2021-07-26 DIAGNOSIS — Z681 Body mass index (BMI) 19 or less, adult: Secondary | ICD-10-CM | POA: Diagnosis not present

## 2021-07-26 DIAGNOSIS — A419 Sepsis, unspecified organism: Secondary | ICD-10-CM | POA: Diagnosis present

## 2021-07-26 DIAGNOSIS — R64 Cachexia: Secondary | ICD-10-CM | POA: Diagnosis present

## 2021-07-26 DIAGNOSIS — Z66 Do not resuscitate: Secondary | ICD-10-CM | POA: Diagnosis not present

## 2021-07-26 DIAGNOSIS — I248 Other forms of acute ischemic heart disease: Secondary | ICD-10-CM | POA: Diagnosis not present

## 2021-07-26 DIAGNOSIS — E43 Unspecified severe protein-calorie malnutrition: Secondary | ICD-10-CM | POA: Diagnosis present

## 2021-07-26 DIAGNOSIS — E785 Hyperlipidemia, unspecified: Secondary | ICD-10-CM | POA: Diagnosis present

## 2021-07-26 DIAGNOSIS — R1310 Dysphagia, unspecified: Secondary | ICD-10-CM | POA: Diagnosis present

## 2021-07-26 DIAGNOSIS — E119 Type 2 diabetes mellitus without complications: Secondary | ICD-10-CM | POA: Diagnosis present

## 2021-07-26 DIAGNOSIS — R4182 Altered mental status, unspecified: Secondary | ICD-10-CM | POA: Diagnosis not present

## 2021-07-26 DIAGNOSIS — F02C0 Dementia in other diseases classified elsewhere, severe, without behavioral disturbance, psychotic disturbance, mood disturbance, and anxiety: Secondary | ICD-10-CM | POA: Diagnosis not present

## 2021-07-26 DIAGNOSIS — Z515 Encounter for palliative care: Secondary | ICD-10-CM | POA: Diagnosis not present

## 2021-07-26 DIAGNOSIS — F01C0 Vascular dementia, severe, without behavioral disturbance, psychotic disturbance, mood disturbance, and anxiety: Secondary | ICD-10-CM | POA: Diagnosis present

## 2021-07-26 DIAGNOSIS — T17908D Unspecified foreign body in respiratory tract, part unspecified causing other injury, subsequent encounter: Secondary | ICD-10-CM | POA: Diagnosis not present

## 2021-07-26 DIAGNOSIS — Z7401 Bed confinement status: Secondary | ICD-10-CM | POA: Diagnosis not present

## 2021-07-26 DIAGNOSIS — R627 Adult failure to thrive: Secondary | ICD-10-CM | POA: Diagnosis present

## 2021-07-26 DIAGNOSIS — G7000 Myasthenia gravis without (acute) exacerbation: Secondary | ICD-10-CM | POA: Diagnosis present

## 2021-07-26 DIAGNOSIS — J690 Pneumonitis due to inhalation of food and vomit: Secondary | ICD-10-CM | POA: Diagnosis present

## 2021-07-26 DIAGNOSIS — I10 Essential (primary) hypertension: Secondary | ICD-10-CM | POA: Diagnosis present

## 2021-07-26 DIAGNOSIS — E876 Hypokalemia: Secondary | ICD-10-CM | POA: Diagnosis not present

## 2021-07-26 DIAGNOSIS — G9341 Metabolic encephalopathy: Secondary | ICD-10-CM | POA: Diagnosis present

## 2021-07-26 DIAGNOSIS — G309 Alzheimer's disease, unspecified: Secondary | ICD-10-CM | POA: Diagnosis present

## 2021-07-26 DIAGNOSIS — Z794 Long term (current) use of insulin: Secondary | ICD-10-CM | POA: Diagnosis not present

## 2021-07-26 DIAGNOSIS — Z79899 Other long term (current) drug therapy: Secondary | ICD-10-CM | POA: Diagnosis not present

## 2021-07-26 LAB — RESP PANEL BY RT-PCR (FLU A&B, COVID) ARPGX2
Influenza A by PCR: NEGATIVE
Influenza B by PCR: NEGATIVE
SARS Coronavirus 2 by RT PCR: NEGATIVE

## 2021-07-26 LAB — TROPONIN I (HIGH SENSITIVITY)
Troponin I (High Sensitivity): 18 ng/L — ABNORMAL HIGH (ref <18)
Troponin I (High Sensitivity): 26 ng/L — ABNORMAL HIGH (ref <18)
Troponin I (High Sensitivity): 33 ng/L — ABNORMAL HIGH (ref <18)
Troponin I (High Sensitivity): 30 ng/L — ABNORMAL HIGH (ref <18)

## 2021-07-26 MED ORDER — POTASSIUM CHLORIDE 10 MEQ/100ML IV SOLN
10.0000 meq | INTRAVENOUS | Status: AC
  Administered 2021-07-26 (×3): 10 meq via INTRAVENOUS
  Filled 2021-07-26 (×2): qty 100

## 2021-07-26 MED ORDER — SODIUM CHLORIDE 0.9 % IV SOLN
3.0000 g | Freq: Four times a day (QID) | INTRAVENOUS | Status: AC
  Administered 2021-07-26 – 2021-08-02 (×28): 3 g via INTRAVENOUS
  Filled 2021-07-26: qty 3
  Filled 2021-07-26: qty 8
  Filled 2021-07-26 (×2): qty 3
  Filled 2021-07-26: qty 8
  Filled 2021-07-26: qty 3
  Filled 2021-07-26: qty 8
  Filled 2021-07-26: qty 3
  Filled 2021-07-26: qty 8
  Filled 2021-07-26 (×2): qty 3
  Filled 2021-07-26: qty 8
  Filled 2021-07-26 (×8): qty 3
  Filled 2021-07-26: qty 8
  Filled 2021-07-26 (×2): qty 3
  Filled 2021-07-26: qty 8
  Filled 2021-07-26 (×2): qty 3
  Filled 2021-07-26 (×2): qty 8

## 2021-07-26 MED ORDER — LORAZEPAM 2 MG/ML IJ SOLN: 2.0000 mg | Freq: Four times a day (QID) | INTRAMUSCULAR | Status: DC | PRN

## 2021-07-26 MED ORDER — ONDANSETRON HCL 4 MG/2ML IJ SOLN: 4.0000 mg | Freq: Four times a day (QID) | INTRAMUSCULAR | Status: DC | PRN

## 2021-07-26 MED ORDER — SODIUM CHLORIDE 0.9 % IV SOLN
500.0000 mg | Freq: Every day | INTRAVENOUS | Status: DC
  Administered 2021-07-26 – 2021-07-27 (×2): 500 mg via INTRAVENOUS
  Filled 2021-07-26 (×3): qty 500

## 2021-07-26 MED ORDER — LEVETIRACETAM IN NACL 500 MG/100ML IV SOLN
500.0000 mg | Freq: Two times a day (BID) | INTRAVENOUS | Status: DC
Start: 2021-07-26 — End: 2021-07-31
  Administered 2021-07-26 – 2021-07-31 (×11): 500 mg via INTRAVENOUS
  Filled 2021-07-26 (×14): qty 100

## 2021-07-26 MED ORDER — ENOXAPARIN SODIUM 40 MG/0.4ML IJ SOSY
40.0000 mg | PREFILLED_SYRINGE | INTRAMUSCULAR | Status: DC
  Administered 2021-07-26 – 2021-08-01 (×7): 40 mg via SUBCUTANEOUS
  Filled 2021-07-26 (×7): qty 0.4

## 2021-07-26 MED ORDER — LABETALOL HCL 5 MG/ML IV SOLN
10.0000 mg | INTRAVENOUS | Status: DC | PRN
  Administered 2021-07-26: 10 mg via INTRAVENOUS
  Filled 2021-07-26: qty 4

## 2021-07-26 MED ORDER — METOPROLOL TARTRATE 5 MG/5ML IV SOLN
2.5000 mg | INTRAVENOUS | Status: DC | PRN
  Administered 2021-07-26 – 2021-07-27 (×3): 2.5 mg via INTRAVENOUS
  Filled 2021-07-26 (×3): qty 5

## 2021-07-26 NOTE — Consult Note (Signed)
Pharmacy Antibiotic Note  Robin Archer is a 75 y.o. female admitted on 07/25/2021 with pneumonia.  Pharmacy has been consulted for Unaysn dosing. On CT angio of the chest, left lung consolidation concerning for infection.  Plan: Unasyn 3 g q6H.   Weight: 46 kg (101 lb 6.6 oz)  Temp (24hrs), Avg:98.5 F (36.9 C), Min:98.3 F (36.8 C), Max:98.7 F (37.1 C)  Recent Labs  Lab 07/25/21 1826 07/25/21 1851  WBC  --  6.6  CREATININE 0.71  --     Estimated Creatinine Clearance: 44.8 mL/min (by C-G formula based on SCr of 0.71 mg/dL).    No Known Allergies  Antimicrobials this admission: 11/15 azithromycin >>  11/15 unasyn >>   Dose adjustments this admission: None  Microbiology results: None  Thank you for allowing pharmacy to be a part of this patient's care.  Ronnald Ramp, PharmD, BCPS 07/26/2021 9:35 AM

## 2021-07-26 NOTE — Procedures (Signed)
Patient Name: Robin Archer  MRN: [id number]  Epilepsy Attending: Charlsie Quest  Referring Physician/Provider: Dr Irena Cords Date: 07/26/2021 Duration: 21.64mins  Patient history: 75 year old female presented with facial twitching.  EEG to evaluate for seizures.  Level of alertness: Awake  AEDs during EEG study: LEV  Technical aspects: This EEG study was done with scalp electrodes positioned according to the 10-20 International system of electrode placement. Electrical activity was acquired at a sampling rate of 500Hz  and reviewed with a high frequency filter of 70Hz  and a low frequency filter of 1Hz . EEG data were recorded continuously and digitally stored.   Description: No posterior dominant rhythm was seen. EEG showed continuous generalized 3 to 5 Hz theta-delta slowing. Hyperventilation and photic stimulation were not performed.     Of note, EEG was technically difficult due to significant movement artifact.  ABNORMALITY - Continuous slow, generalized  IMPRESSION: This technically difficult study is suggestive of moderate diffuse encephalopathy, nonspecific etiology. No seizures or epileptiform discharges were seen throughout the recording.  Robin Archer 

## 2021-07-26 NOTE — Progress Notes (Signed)
Eeg done 

## 2021-07-26 NOTE — ED Notes (Signed)
Arrived in room to find patient had vomited. Airway clear, mouth suctioned. Pt cleaned up and linens/gown changed.

## 2021-07-26 NOTE — Progress Notes (Signed)
PROGRESS NOTE  Robin Archer GYI:[id number] DOB: 1946-02-18 DOA: 07/25/2021 PCP: Keane Police, MD  HPI/Recap of past 24 hours: Robin Archer is a 75 y.o. female with past medical history significant for Alzheimer's dementia, myasthenia gravis, spinal stenosis, hypertension, type 2 diabetes, GERD, who presented to [facility] ED with complaints of facial twitching with concerns of seizure.  Patient at baseline is bedbound and is nonverbal.  Has spontaneous eye opening and is noncommunicative for the most part.  Per report patient has not been at her baseline.  Per her daughter at bedside she has been very somnolent recently.  Upon arrival to the ED, patient was tachycardic, hypotensive, with rhythmic twitching of her face and contraction of the neck, reported by EDP with concern for new onset seizure activity.  Work-up in the ED showed negative head CT and negative UA, no evidence of pulmonary embolism on CT angio chest, suspected early aspiration on CT scan.  EEG ordered and pending.  She was started on IV Keppra and empiric antibiotics for aspiration pneumonia.  Admitted by hospitalist service.  07/26/2021: Patient was seen and examined at her bedside in the ED, she is minimally responsive.  Had an episode of emesis while in the ED.  Assessment/Plan: Principal Problem:   Altered mental state Active Problems:   HTN (hypertension)   Myasthenia gravis (HCC)   Type II diabetes mellitus (HCC)   GERD (gastroesophageal reflux disease)   Seizures (HCC)   Pneumonia  Acute metabolic encephalopathy in the setting of dementia, suspect multifactorial secondary to possible seizure activity, early aspiration pneumonia Continue to treat underlying conditions She was started on Keppra and IV antibiotics on admission Continue Unasyn for presumed aspiration pneumonia Reorient as needed Aspiration/fall/delirium precautions One-to-one sitter for patient's own safety, patient is not able to call  for assistance.  Suspected new onset seizure activity Seizure-like activity in the ED Was started on IV Keppra Follow EEG Seizure precautions Neurology consulted  Early aspiration pneumonia seen on CT scan with concern for dysphagia Started on Unasyn on 07/26/2021, x5 days. Personally reviewed CT scan, no evidence of pulmonary embolism, shows infiltrates involving the left lung. Aspiration precautions Speech therapist evaluation once more alert  Elevated troponin, suspect demand ischemia Troponin 33 Cycle troponin No evidence of acute ischemia on twelve-lead EKG Monitor on telemetry  Hypokalemia Serum potassium 3.2 Repleted intravenously  Hypomagnesemia Serum magnesium 1.6 Repleted intravenously.  Essential hypertension Continue IV antihypertensives while NPO  Myasthenia gravis Not on treatment  Physical debility/generalized weakness PT OT to assess Fall precautions  Severe protein calorie malnutrition BMI 15 Severe muscle mass loss Once more alert encourage oral protein calorie intake Consider dietary consult once more alert.   Critical care time: 65 minutes.    Code Status: DNR  Family Communication: Daughter at bedside  Disposition Plan: Will likely discharge back to SNF once hemodynamically stable.   Consultants: Neurology Speech therapist  Procedures: None  Antimicrobials: Unasyn IV azithromycin  DVT prophylaxis: Subcu Lovenox daily  Status is: Inpatient  Inpatient status.  Patient will require at least 2 midnights for further evaluation and treatment of present condition.      Objective: Vitals:   07/26/21 1327 07/26/21 1330 07/26/21 1331 07/26/21 1400  BP:  (!) 196/93  (!) 188/90  Pulse: (!) 120 (!) 125 (!) 124 (!) 125  Resp: 19 16 17 13   Temp:      TempSrc:      SpO2: 98% 100%  99%  Weight:  Intake/Output Summary (Last 24 hours) at 07/26/2021 1432 Last data filed at 07/26/2021 1008 Gross per 24 hour  Intake  1595.78 ml  Output 650 ml  Net 945.78 ml   Filed Weights   07/25/21 1831  Weight: 46 kg    Exam:  General: 75 y.o. year-old female frail-appearing in no acute distress.  Minimally responsive. Cardiovascular: Tachycardic with no rubs or gallops.  No thyromegaly or JVD noted.   Respiratory: Mild rales noted at left lung field.  Poor inspiratory effort. Abdomen: Soft nontender nondistended with normal bowel sounds x4 quadrants. Musculoskeletal: No lower extremity edema. 2/4 pulses in all 4 extremities. Skin: No ulcerative lesions noted or rashes, Psychiatry: Unable to assess mood due to minimal responsiveness. Neuro: Contractures affecting lower extremities bilaterally.     Data Reviewed: CBC: Recent Labs  Lab 07/25/21 1851  WBC 6.6  NEUTROABS 5.2  HGB 11.2*  HCT 34.2*  MCV 84.0  PLT 192   Basic Metabolic Panel: Recent Labs  Lab 07/25/21 1826  NA 143  K 3.2*  CL 105  CO2 25  GLUCOSE 201*  BUN 19  CREATININE 0.71  CALCIUM 9.7  MG 1.6*   GFR: Estimated Creatinine Clearance: 44.8 mL/min (by C-G formula based on SCr of 0.71 mg/dL). Liver Function Tests: Recent Labs  Lab 07/25/21 1826  AST 20  ALT 11  ALKPHOS 64  BILITOT 0.6  PROT 7.4  ALBUMIN 4.1   No results for input(s): LIPASE, AMYLASE in the last 168 hours. No results for input(s): AMMONIA in the last 168 hours. Coagulation Profile: No results for input(s): INR, PROTIME in the last 168 hours. Cardiac Enzymes: Recent Labs  Lab 07/25/21 2047  CKTOTAL 23*   BNP (last 3 results) No results for input(s): PROBNP in the last 8760 hours. HbA1C: No results for input(s): HGBA1C in the last 72 hours. CBG: No results for input(s): GLUCAP in the last 168 hours. Lipid Profile: No results for input(s): CHOL, HDL, LDLCALC, TRIG, CHOLHDL, LDLDIRECT in the last 72 hours. Thyroid Function Tests: Recent Labs    07/25/21 2047  TSH 1.448   Anemia Panel: No results for input(s): VITAMINB12, FOLATE, FERRITIN,  TIBC, IRON, RETICCTPCT in the last 72 hours. Urine analysis:    Component Value Date/Time   COLORURINE STRAW (A) 07/25/2021 2047   APPEARANCEUR CLEAR (A) 07/25/2021 2047   APPEARANCEUR Clear 09/30/2014 2123   LABSPEC 1.008 07/25/2021 2047   LABSPEC 1.012 09/30/2014 2123   PHURINE 7.0 07/25/2021 2047   GLUCOSEU NEGATIVE 07/25/2021 2047   GLUCOSEU Negative 09/30/2014 2123   HGBUR NEGATIVE 07/25/2021 2047   BILIRUBINUR NEGATIVE 07/25/2021 2047   BILIRUBINUR Negative 09/30/2014 2123   KETONESUR 5 (A) 07/25/2021 2047   PROTEINUR NEGATIVE 07/25/2021 2047   NITRITE NEGATIVE 07/25/2021 2047   LEUKOCYTESUR NEGATIVE 07/25/2021 2047   LEUKOCYTESUR Negative 09/30/2014 2123   Sepsis Labs: @LABRCNTIP (procalcitonin:4,lacticidven:4)  ) Recent Results (from the past 240 hour(s))  Resp Panel by RT-PCR (Flu A&B, Covid) Nasopharyngeal Swab     Status: None   Collection Time: 07/26/21  1:15 AM   Specimen: Nasopharyngeal Swab; Nasopharyngeal(NP) swabs in vial transport medium  Result Value Ref Range Status   SARS Coronavirus 2 by RT PCR NEGATIVE NEGATIVE Final    Comment: (NOTE) SARS-CoV-2 target nucleic acids are NOT DETECTED.  The SARS-CoV-2 RNA is generally detectable in upper respiratory specimens during the acute phase of infection. The lowest concentration of SARS-CoV-2 viral copies this assay can detect is 138 copies/mL. A negative result does not  preclude SARS-Cov-2 infection and should not be used as the sole basis for treatment or other patient management decisions. A negative result may occur with  improper specimen collection/handling, submission of specimen other than nasopharyngeal swab, presence of viral mutation(s) within the areas targeted by this assay, and inadequate number of viral copies(<138 copies/mL). A negative result must be combined with clinical observations, patient history, and epidemiological information. The expected result is Negative.  Fact Sheet for  Patients:  BloggerCourse.com  Fact Sheet for Healthcare Providers:  SeriousBroker.it  This test is no t yet approved or cleared by the [place] FDA and  has been authorized for detection and/or diagnosis of SARS-CoV-2 by FDA under an Emergency Use Authorization (EUA). This EUA will remain  in effect (meaning this test can be used) for the duration of the COVID-19 declaration under Section 564(b)(1) of the Act, 21 U.S.C.section 360bbb-3(b)(1), unless the authorization is terminated  or revoked sooner.       Influenza A by PCR NEGATIVE NEGATIVE Final   Influenza B by PCR NEGATIVE NEGATIVE Final    Comment: (NOTE) The Xpert Xpress SARS-CoV-2/FLU/RSV plus assay is intended as an aid in the diagnosis of influenza from Nasopharyngeal swab specimens and should not be used as a sole basis for treatment. Nasal washings and aspirates are unacceptable for Xpert Xpress SARS-CoV-2/FLU/RSV testing.  Fact Sheet for Patients: BloggerCourse.com  Fact Sheet for Healthcare Providers: SeriousBroker.it  This test is not yet approved or cleared by the [place] FDA and has been authorized for detection and/or diagnosis of SARS-CoV-2 by FDA under an Emergency Use Authorization (EUA). This EUA will remain in effect (meaning this test can be used) for the duration of the COVID-19 declaration under Section 564(b)(1) of the Act, 21 U.S.C. section 360bbb-3(b)(1), unless the authorization is terminated or revoked.  Performed at [facility], [address], [place], [place] [address]       Studies: CT HEAD WO CONTRAST ( )  Result Date: 07/25/2021 CLINICAL DATA:  Seizure. EXAM: CT HEAD WITHOUT CONTRAST TECHNIQUE: Contiguous axial images were obtained from the base of the skull through the vertex without intravenous contrast. COMPARISON:  Head CT dated 11/10/2016. FINDINGS: Brain:  Moderate age-related atrophy and chronic microvascular ischemic changes. Interval increase in the ex vacuo dilatation of the left lateral ventricle. There is no acute intracranial hemorrhage. No mass effect or midline shift. No extra-axial fluid collection. Vascular: No hyperdense vessel or unexpected calcification. Skull: Normal. Negative for fracture or focal lesion. Sinuses/Orbits: No acute finding. Other: None IMPRESSION: 1. No acute intracranial pathology. 2. Moderate age-related atrophy and chronic microvascular ischemic changes. Electronically Signed   By: Elgie Collard M.D.   On: 07/25/2021 19:24   CT Angio Chest Pulmonary Embolism (PE) W or WO Contrast  Result Date: 07/25/2021 CLINICAL DATA:  Concern for pulmonary embolism. EXAM: CT ANGIOGRAPHY CHEST WITH CONTRAST TECHNIQUE: Multidetector CT imaging of the chest was performed using the standard protocol during bolus administration of intravenous contrast. Multiplanar CT image reconstructions and MIPs were obtained to evaluate the vascular anatomy. CONTRAST:  49mL OMNIPAQUE IOHEXOL 350 MG/ML SOLN COMPARISON:  Chest radiograph dated 07/25/2021. chest CT dated 04/15/2015. FINDINGS: Evaluation of this exam is limited due to respiratory motion artifact. Cardiovascular: There is no cardiomegaly. Small pericardial effusion measuring 9 mm in thickness anterior to the heart. There is coronary vascular calcification. The thoracic aorta is unremarkable. The origins of the great vessels of the aortic arch appear patent. Evaluation of the pulmonary arteries is very limited  due to respiratory motion artifact and suboptimal opacification and timing of the contrast. No definite large or central pulmonary artery embolus identified. Mediastinum/Nodes: No hilar or mediastinal adenopathy. The esophagus is grossly unremarkable. No mediastinal fluid collection. Lungs/Pleura: Diffuse hazy and streaky density throughout the left lung may be related to atelectasis. Atypical  infection is not excluded. Clinical correlation is recommended. No lobar consolidation, pleural effusion, or pneumothorax. The central airways are patent. Upper Abdomen: Small scattered calcified liver granuloma. Musculoskeletal: Degenerative changes of the spine and osteopenia. No acute osseous pathology. Review of the MIP images confirms the above findings. IMPRESSION: 1. No large central pulmonary artery embolus. 2. Diffuse hazy and streaky density throughout the left lung may be related to atelectasis. Atypical infection is not excluded. 3. Small pericardial effusion. 4. Aortic Atherosclerosis (ICD10-I70.0). Electronically Signed   By: Elgie Collard M.D.   On: 07/25/2021 23:20   DG Chest Portable 1 View  Result Date: 07/25/2021 CLINICAL DATA:  Seizure-like activity.  Altered mental status EXAM: PORTABLE CHEST 1 VIEW.  Patient is rotated. COMPARISON:  Chest x-ray 04/15/2015 FINDINGS: The heart and mediastinal contours are unchanged. No focal consolidation. No pulmonary edema. No pleural effusion. No pneumothorax. No acute osseous abnormality. Likely old healed right proximal humeral fracture. IMPRESSION: No active disease. Electronically Signed   By: Tish Frederickson M.D.   On: 07/25/2021 19:15    Scheduled Meds:  metoprolol tartrate  2.5 mg Intravenous Once    Continuous Infusions:  ampicillin-sulbactam (UNASYN) IV Stopped (07/26/21 1020)   azithromycin Stopped (07/26/21 2585)   levETIRAcetam Stopped (07/26/21 2778)   sodium chloride       LOS: 0 days     Darlin Drop, MD [facility] Pager [phone]  If 7PM-7AM, please contact night-coverage [email or web address] Password [facility] 07/26/2021, 2:32 PM

## 2021-07-27 ENCOUNTER — Inpatient Hospital Stay: Payer: Medicare Other

## 2021-07-27 DIAGNOSIS — R4182 Altered mental status, unspecified: Secondary | ICD-10-CM | POA: Diagnosis not present

## 2021-07-27 LAB — CBC WITH DIFFERENTIAL/PLATELET
Abs Immature Granulocytes: 0.05 10*3/uL (ref 0.00–0.07)
Basophils Absolute: 0 10*3/uL (ref 0.0–0.1)
Basophils Relative: 0 %
Eosinophils Absolute: 0 10*3/uL (ref 0.0–0.5)
Eosinophils Relative: 0 %
HCT: 34.1 % — ABNORMAL LOW (ref 36.0–46.0)
Hemoglobin: 11.4 g/dL — ABNORMAL LOW (ref 12.0–15.0)
Immature Granulocytes: 1 %
Lymphocytes Relative: 12 %
Lymphs Abs: 1.1 10*3/uL (ref 0.7–4.0)
MCH: 27.9 pg (ref 26.0–34.0)
MCHC: 33.4 g/dL (ref 30.0–36.0)
MCV: 83.6 fL (ref 80.0–100.0)
Monocytes Absolute: 0.7 10*3/uL (ref 0.1–1.0)
Monocytes Relative: 7 %
Neutro Abs: 7.3 10*3/uL (ref 1.7–7.7)
Neutrophils Relative %: 80 %
Platelets: 237 10*3/uL (ref 150–400)
RBC: 4.08 MIL/uL (ref 3.87–5.11)
RDW: 13.5 % (ref 11.5–15.5)
WBC: 9.2 10*3/uL (ref 4.0–10.5)
nRBC: 0 % (ref 0.0–0.2)

## 2021-07-27 LAB — LACTIC ACID, PLASMA
Lactic Acid, Venous: 2.2 mmol/L (ref 0.5–1.9)
Lactic Acid, Venous: 2.9 mmol/L (ref 0.5–1.9)
Lactic Acid, Venous: 2.6 mmol/L (ref 0.5–1.9)
Lactic Acid, Venous: 2 mmol/L (ref 0.5–1.9)

## 2021-07-27 LAB — COMPREHENSIVE METABOLIC PANEL
ALT: 10 U/L (ref 0–44)
AST: 23 U/L (ref 15–41)
Albumin: 3.7 g/dL (ref 3.5–5.0)
Alkaline Phosphatase: 52 U/L (ref 38–126)
Anion gap: 10 (ref 5–15)
BUN: 13 mg/dL (ref 8–23)
CO2: 24 mmol/L (ref 22–32)
Calcium: 8.2 mg/dL — ABNORMAL LOW (ref 8.9–10.3)
Chloride: 105 mmol/L (ref 98–111)
Creatinine, Ser: 0.75 mg/dL (ref 0.44–1.00)
GFR, Estimated: >60 mL/min (ref >60)
Glucose, Bld: 193 mg/dL — ABNORMAL HIGH (ref 70–99)
Potassium: 3.6 mmol/L (ref 3.5–5.1)
Sodium: 139 mmol/L (ref 135–145)
Total Bilirubin: 0.9 mg/dL (ref 0.3–1.2)
Total Protein: 6.6 g/dL (ref 6.5–8.1)

## 2021-07-27 LAB — URINALYSIS, COMPLETE (UACMP) WITH MICROSCOPIC
Bacteria, UA: NONE SEEN
Bilirubin Urine: NEGATIVE
Glucose, UA: >=500 mg/dL — AB
Hgb urine dipstick: NEGATIVE
Ketones, ur: 20 mg/dL — AB
Leukocytes,Ua: NEGATIVE
Nitrite: NEGATIVE
Protein, ur: 100 mg/dL — AB
Specific Gravity, Urine: 1.027 (ref 1.005–1.030)
pH: 6 (ref 5.0–8.0)

## 2021-07-27 LAB — PHOSPHORUS: Phosphorus: 2.5 mg/dL (ref 2.5–4.6)

## 2021-07-27 LAB — MRSA NEXT GEN BY PCR, NASAL: MRSA by PCR Next Gen: NOT DETECTED

## 2021-07-27 LAB — MAGNESIUM: Magnesium: 1.7 mg/dL (ref 1.7–2.4)

## 2021-07-27 LAB — PROCALCITONIN: Procalcitonin: <0.1 ng/mL

## 2021-07-27 LAB — TROPONIN I (HIGH SENSITIVITY): Troponin I (High Sensitivity): 34 ng/L — ABNORMAL HIGH (ref <18)

## 2021-07-27 MED ORDER — ACETAMINOPHEN 325 MG PO TABS
650.0000 mg | ORAL_TABLET | Freq: Four times a day (QID) | ORAL | Status: DC | PRN
  Filled 2021-07-27: qty 2

## 2021-07-27 MED ORDER — ACETAMINOPHEN 650 MG RE SUPP
650.0000 mg | RECTAL | Status: DC | PRN
  Administered 2021-07-27: 650 mg via RECTAL
  Filled 2021-07-27: qty 1

## 2021-07-27 MED ORDER — POTASSIUM CHLORIDE IN NACL 20-0.9 MEQ/L-% IV SOLN
INTRAVENOUS | Status: DC
  Filled 2021-07-27 (×9): qty 1000

## 2021-07-27 MED ORDER — SODIUM CHLORIDE 0.9 % IV BOLUS
500.0000 mL | Freq: Once | INTRAVENOUS | Status: AC
  Administered 2021-07-27: 01:00:00 500 mL via INTRAVENOUS

## 2021-07-27 MED ORDER — MAGNESIUM SULFATE 2 GM/50ML IV SOLN
2.0000 g | Freq: Once | INTRAVENOUS | Status: AC
  Administered 2021-07-27: 09:00:00 2 g via INTRAVENOUS
  Filled 2021-07-27: qty 50

## 2021-07-27 MED ORDER — LABETALOL HCL 5 MG/ML IV SOLN
10.0000 mg | INTRAVENOUS | Status: DC | PRN
  Administered 2021-07-27 – 2021-08-01 (×7): 10 mg via INTRAVENOUS
  Filled 2021-07-27 (×7): qty 4

## 2021-07-27 MED ORDER — POTASSIUM CHLORIDE CRYS ER 20 MEQ PO TBCR
40.0000 meq | EXTENDED_RELEASE_TABLET | Freq: Once | ORAL | Status: DC
  Filled 2021-07-27: qty 2

## 2021-07-27 NOTE — Significant Event (Signed)
Rapid Response Event Note   Reason for Call :  Fever 102.6  HR 122 ReD Mews Initial Focused Assessment:  Patient resting in bed. Patient is nonverbal at baseline per primary nurse. Patient cardiac monitoring showing sinus tact.  Patients Vitals on arrival BP 145/84 Hr 123 o2 100 on RA.  Interventions:  NP contacted regarding fever and HR. PRN order for tylenol placed and one time order placed for 500 mls of normal saline.  Plan of Care:  Evaluated patient response to to the above intervention and contact provider if no improvement or worsening condition.    Event Summary:   MD Notified: Webb Silversmith NP  Call [phone] Arrival Time:0037 End FQMK:1031  Judyann Munson, RN

## 2021-07-27 NOTE — Progress Notes (Signed)
CODE SEPSIS - PHARMACY COMMUNICATION  **Broad Spectrum Antibiotics should be administered within 1 hour of Sepsis diagnosis**  Time Code Sepsis Called/Page Received: 0216  Antibiotics Ordered: Unasyn & Azithromycin  Time of 1st antibiotic administration: 11/15 @ 0832  Otelia Sergeant, PharmD, [facility] 07/27/2021 2:14 AM

## 2021-07-27 NOTE — Progress Notes (Signed)
Notified provider of need to order repeat lactic acid. ° °

## 2021-07-27 NOTE — Progress Notes (Signed)
Preformed [facility] Swallow Screening on patient at request of family. Patient did NOT pass. Patient was able to follow directions, however she was not able to drink 3 oz without stopping, and patient coughed after swallowing. I will keep patient NPO until SLP can return to assess her

## 2021-07-27 NOTE — Progress Notes (Signed)
PROGRESS NOTE    Robin Archer  ZES:[id number] DOB: 10-04-1945 DOA: 07/25/2021 PCP: Alvester Morin, MD    Brief Narrative:  75 y.o. female with past medical history significant for Alzheimer's dementia, myasthenia gravis, spinal stenosis, hypertension, type 2 diabetes, GERD, who presented to [facility] ED with complaints of facial twitching with concerns of seizure.  Patient at baseline is bedbound and is nonverbal.  Has spontaneous eye opening and is noncommunicative for the most part.  Per report patient has not been at her baseline.  Per her daughter at bedside she has been very somnolent recently.  Upon arrival to the ED, patient was tachycardic, hypotensive, with rhythmic twitching of her face and contraction of the neck, reported by EDP with concern for new onset seizure activity.  Work-up in the ED showed negative head CT and negative UA, no evidence of pulmonary embolism on CT angio chest, suspected early aspiration on CT scan.  EEG ordered and pending.  She was started on IV Keppra and empiric antibiotics for aspiration pneumonia.  Admitted by hospitalist service.   07/26/2021: Patient was seen and examined at her bedside in the ED, she is minimally responsive.  Had an episode of emesis while in the ED. 11/16: No appreciable status changes.  Started on Unasyn and azithromycin for presumed aspiration pneumonia and resultant sepsis.  Discussed care plan with neurology.  Patient with poor quality of life and declining over the past several months.   Assessment & Plan:   Principal Problem:   Altered mental state Active Problems:   HTN (hypertension)   Myasthenia gravis (HCC)   Type II diabetes mellitus (HCC)   GERD (gastroesophageal reflux disease)   Seizures (HCC)   Pneumonia   AMS (altered mental status)  Aspiration pneumonia Acute metabolic encephalopathy secondary to above Severe sepsis secondary to aspiration Sepsis criteria met with tachycardia and fever.  Severe  sepsis criteria met with elevated lactic acid Presumed source aspiration Plan: Continue Unasyn and azithromycin IV fluids Follow cultures Monitor vitals and fever curve  Twitching activity in ED, possible new onset seizure Neurology consulted EEG negative Plan: Continue Keppra for now Can consider lumbar tap to evaluate CNS source of infection however unlikely to change overall outcome as patient's quality of life and functional status very poor baseline Palliative consult  Elevated troponin suspect supply demand ischemia No evidence of ACS   Hypokalemia Monitor and replace as necessary   Hypomagnesemia Monitor and replace as necessary   Essential hypertension Continue IV antihypertensives while NPO   Myasthenia gravis Not on treatment   Physical debility/generalized weakness Adult failure to thrive Severe protein calorie malnutrition BMI 15 Severe muscle mass loss Plan: Palliative consultation RD consultation SLP consultation   DVT prophylaxis: SQ Lovenox Code Status: DNR Family Communication: Daughter [name] [phone] on 11/16 Disposition Plan: Status is: Inpatient  Remains inpatient appropriate because: Severe sepsis secondary to aspiration pneumonia     Level of care: Telemetry Medical  Consultants:  Palliative care  Procedures:  None  Antimicrobials: Unasyn Azithromycin   Subjective: Seen and examined.  Verbally unresponsive.  Visibly no distress  Objective: Vitals:   07/27/21 0410 07/27/21 0529 07/27/21 0539 07/27/21 0830  BP: (!) 171/78 (!) 172/79 131/63 (!) 155/73  Pulse: (!) 108  97 100  Resp:    18  Temp: 98.7 F (37.1 C)   98.8 F (37.1 C)  TempSrc: Axillary   Oral  SpO2:    100%  Weight:       No intake or  output data in the 24 hours ending 07/27/21 1043 Filed Weights   07/25/21 1831  Weight: 46 kg    Examination:  General exam: No acute distress.  Appears frail and chronically ill Respiratory system: Bibasilar  crackles.  Normal work of breathing.  2 L Cardiovascular system: Tachycardic, regular rhythm, no murmurs, no pedal edema Gastrointestinal system: Thin/scaphoid, nontender, nondistended, decreased bowel sounds Central nervous system: Verbally unresponsive. Extremities: Diffuse muscle wasting bilaterally.  Decreased power Skin: No rashes, lesions or ulcers Psychiatry: Unable to assess    Data Reviewed: I have personally reviewed following labs and imaging studies  CBC: Recent Labs  Lab 07/25/21 1851 07/27/21 0520  WBC 6.6 9.2  NEUTROABS 5.2 7.3  HGB 11.2* 11.4*  HCT 34.2* 34.1*  MCV 84.0 83.6  PLT 192 264   Basic Metabolic Panel: Recent Labs  Lab 07/25/21 1826 07/27/21 0520  NA 143 139  K 3.2* 3.6  CL 105 105  CO2 25 24  GLUCOSE 201* 193*  BUN 19 13  CREATININE 0.71 0.75  CALCIUM 9.7 8.2*  MG 1.6* 1.7  PHOS  --  2.5   GFR: Estimated Creatinine Clearance: 44.8 mL/min (by C-G formula based on SCr of 0.75 mg/dL). Liver Function Tests: Recent Labs  Lab 07/25/21 1826 07/27/21 0520  AST 20 23  ALT 11 10  ALKPHOS 64 52  BILITOT 0.6 0.9  PROT 7.4 6.6  ALBUMIN 4.1 3.7   No results for input(s): LIPASE, AMYLASE in the last 168 hours. No results for input(s): AMMONIA in the last 168 hours. Coagulation Profile: No results for input(s): INR, PROTIME in the last 168 hours. Cardiac Enzymes: Recent Labs  Lab 07/25/21 2047  CKTOTAL 23*   BNP (last 3 results) No results for input(s): PROBNP in the last 8760 hours. HbA1C: No results for input(s): HGBA1C in the last 72 hours. CBG: No results for input(s): GLUCAP in the last 168 hours. Lipid Profile: No results for input(s): CHOL, HDL, LDLCALC, TRIG, CHOLHDL, LDLDIRECT in the last 72 hours. Thyroid Function Tests: Recent Labs    07/25/21 2047  TSH 1.448   Anemia Panel: No results for input(s): VITAMINB12, FOLATE, FERRITIN, TIBC, IRON, RETICCTPCT in the last 72 hours. Sepsis Labs: Recent Labs  Lab  07/27/21 0238 07/27/21 0520 07/27/21 0739  PROCALCITON <0.10  --   --   LATICACIDVEN 2.2* 2.9* 2.6*    Recent Results (from the past 240 hour(s))  Resp Panel by RT-PCR (Flu A&B, Covid) Nasopharyngeal Swab     Status: None   Collection Time: 07/26/21  1:15 AM   Specimen: Nasopharyngeal Swab; Nasopharyngeal(NP) swabs in vial transport medium  Result Value Ref Range Status   SARS Coronavirus 2 by RT PCR NEGATIVE NEGATIVE Final    Comment: (NOTE) SARS-CoV-2 target nucleic acids are NOT DETECTED.  The SARS-CoV-2 RNA is generally detectable in upper respiratory specimens during the acute phase of infection. The lowest concentration of SARS-CoV-2 viral copies this assay can detect is 138 copies/mL. A negative result does not preclude SARS-Cov-2 infection and should not be used as the sole basis for treatment or other patient management decisions. A negative result may occur with  improper specimen collection/handling, submission of specimen other than nasopharyngeal swab, presence of viral mutation(s) within the areas targeted by this assay, and inadequate number of viral copies(<138 copies/mL). A negative result must be combined with clinical observations, patient history, and epidemiological information. The expected result is Negative.  Fact Sheet for Patients:  EntrepreneurPulse.com.au  Fact Sheet for Healthcare  Providers:  IncredibleEmployment.be  This test is no t yet approved or cleared by the [place] and  has been authorized for detection and/or diagnosis of SARS-CoV-2 by FDA under an Emergency Use Authorization (EUA). This EUA will remain  in effect (meaning this test can be used) for the duration of the COVID-19 declaration under Section 564(b)(1) of the Act, 21 U.S.C.section 360bbb-3(b)(1), unless the authorization is terminated  or revoked sooner.       Influenza A by PCR NEGATIVE NEGATIVE Final   Influenza B by PCR  NEGATIVE NEGATIVE Final    Comment: (NOTE) The Xpert Xpress SARS-CoV-2/FLU/RSV plus assay is intended as an aid in the diagnosis of influenza from Nasopharyngeal swab specimens and should not be used as a sole basis for treatment. Nasal washings and aspirates are unacceptable for Xpert Xpress SARS-CoV-2/FLU/RSV testing.  Fact Sheet for Patients: EntrepreneurPulse.com.au  Fact Sheet for Healthcare Providers: IncredibleEmployment.be  This test is not yet approved or cleared by the [place] FDA and has been authorized for detection and/or diagnosis of SARS-CoV-2 by FDA under an Emergency Use Authorization (EUA). This EUA will remain in effect (meaning this test can be used) for the duration of the COVID-19 declaration under Section 564(b)(1) of the Act, 21 U.S.C. section 360bbb-3(b)(1), unless the authorization is terminated or revoked.  Performed at [facility], [address] [place], [place] [address]   CULTURE, BLOOD (ROUTINE X 2) w Reflex to ID Panel     Status: None (Preliminary result)   Collection Time: 07/27/21  2:37 AM   Specimen: BLOOD  Result Value Ref Range Status   Specimen Description BLOOD RIGHT ARM  Final   Special Requests   Final    BOTTLES DRAWN AEROBIC AND ANAEROBIC Blood Culture adequate volume   Culture   Final    NO GROWTH < 12 HOURS Performed at [facility], [address] [place], [place] [address]    Report Status PENDING  Incomplete  CULTURE, BLOOD (ROUTINE X 2) w Reflex to ID Panel     Status: None (Preliminary result)   Collection Time: 07/27/21  2:39 AM   Specimen: BLOOD  Result Value Ref Range Status   Specimen Description BLOOD RIGHT HAND  Final   Special Requests   Final    BOTTLES DRAWN AEROBIC AND ANAEROBIC Blood Culture adequate volume   Culture   Final    NO GROWTH < 12 HOURS Performed at [facility], [address], [place], [place] [address]    Report Status  PENDING  Incomplete  MRSA Next Gen by PCR, Nasal     Status: None   Collection Time: 07/27/21  2:45 AM   Specimen: Nasal Swab  Result Value Ref Range Status   MRSA by PCR Next Gen NOT DETECTED NOT DETECTED Final    Comment: (NOTE) The GeneXpert MRSA Assay (FDA approved for NASAL specimens only), is one component of a comprehensive MRSA colonization surveillance program. It is not intended to diagnose MRSA infection nor to guide or monitor treatment for MRSA infections. Test performance is not FDA approved in patients less than 31 years old. Performed at [facility], [address], [place], [place] [address]          Radiology Studies: DG Chest 1 View  Result Date: 07/27/2021 CLINICAL DATA:  Shortness of breath and possible aspiration EXAM: PORTABLE CHEST 1 VIEW COMPARISON:  07/25/2021 FINDINGS: Cardiac shadow is stable. The lungs are clear bilaterally. No infiltrate or effusion  is noted. No acute bony abnormality is seen. IMPRESSION: No active disease. Electronically Signed   By: Inez Catalina M.D.   On: 07/27/2021 02:26   CT HEAD WO CONTRAST (5MM)  Result Date: 07/25/2021 CLINICAL DATA:  Seizure. EXAM: CT HEAD WITHOUT CONTRAST TECHNIQUE: Contiguous axial images were obtained from the base of the skull through the vertex without intravenous contrast. COMPARISON:  Head CT dated 11/10/2016. FINDINGS: Brain: Moderate age-related atrophy and chronic microvascular ischemic changes. Interval increase in the ex vacuo dilatation of the left lateral ventricle. There is no acute intracranial hemorrhage. No mass effect or midline shift. No extra-axial fluid collection. Vascular: No hyperdense vessel or unexpected calcification. Skull: Normal. Negative for fracture or focal lesion. Sinuses/Orbits: No acute finding. Other: None IMPRESSION: 1. No acute intracranial pathology. 2. Moderate age-related atrophy and chronic microvascular ischemic changes. Electronically Signed   By: Anner Crete M.D.   On: 07/25/2021 19:24   CT Angio Chest Pulmonary Embolism (PE) W or WO Contrast  Result Date: 07/25/2021 CLINICAL DATA:  Concern for pulmonary embolism. EXAM: CT ANGIOGRAPHY CHEST WITH CONTRAST TECHNIQUE: Multidetector CT imaging of the chest was performed using the standard protocol during bolus administration of intravenous contrast. Multiplanar CT image reconstructions and MIPs were obtained to evaluate the vascular anatomy. CONTRAST:  39m OMNIPAQUE IOHEXOL 350 MG/ML SOLN COMPARISON:  Chest radiograph dated 07/25/2021. chest CT dated 04/15/2015. FINDINGS: Evaluation of this exam is limited due to respiratory motion artifact. Cardiovascular: There is no cardiomegaly. Small pericardial effusion measuring 9 mm in thickness anterior to the heart. There is coronary vascular calcification. The thoracic aorta is unremarkable. The origins of the great vessels of the aortic arch appear patent. Evaluation of the pulmonary arteries is very limited due to respiratory motion artifact and suboptimal opacification and timing of the contrast. No definite large or central pulmonary artery embolus identified. Mediastinum/Nodes: No hilar or mediastinal adenopathy. The esophagus is grossly unremarkable. No mediastinal fluid collection. Lungs/Pleura: Diffuse hazy and streaky density throughout the left lung may be related to atelectasis. Atypical infection is not excluded. Clinical correlation is recommended. No lobar consolidation, pleural effusion, or pneumothorax. The central airways are patent. Upper Abdomen: Small scattered calcified liver granuloma. Musculoskeletal: Degenerative changes of the spine and osteopenia. No acute osseous pathology. Review of the MIP images confirms the above findings. IMPRESSION: 1. No large central pulmonary artery embolus. 2. Diffuse hazy and streaky density throughout the left lung may be related to atelectasis. Atypical infection is not excluded. 3. Small pericardial  effusion. 4. Aortic Atherosclerosis (ICD10-I70.0). Electronically Signed   By: AAnner CreteM.D.   On: 07/25/2021 23:20   DG Chest Portable 1 View  Result Date: 07/25/2021 CLINICAL DATA:  Seizure-like activity.  Altered mental status EXAM: PORTABLE CHEST 1 VIEW.  Patient is rotated. COMPARISON:  Chest x-ray 04/15/2015 FINDINGS: The heart and mediastinal contours are unchanged. No focal consolidation. No pulmonary edema. No pleural effusion. No pneumothorax. No acute osseous abnormality. Likely old healed right proximal humeral fracture. IMPRESSION: No active disease. Electronically Signed   By: MIven FinnM.D.   On: 07/25/2021 19:15   EEG adult  Result Date: 07/26/2021 YLora Havens MD     07/26/2021  2:55 PM Patient Name: CJuanetta NegashMRN: [id number] Attending: PLora HavensReferring Physician/Provider: Dr EFlorina OuDate: 07/26/2021 Duration: 21.160ms Patient history: [age]ear old female presented with facial twitching.  EEG to evaluate for seizures. Level of alertness: Awake AEDs during EEG study: LEV Technical aspects: This EEG study  was done with scalp electrodes positioned according to the 10-20 International system of electrode placement. Electrical activity was acquired at a sampling rate of 500Hz  and reviewed with a high frequency filter of 70Hz  and a low frequency filter of 1Hz . EEG data were recorded continuously and digitally stored. Description: No posterior dominant rhythm was seen. EEG showed continuous generalized 3 to 5 Hz theta-delta slowing. Hyperventilation and photic stimulation were not performed.   Of note, EEG was technically difficult due to significant movement artifact. ABNORMALITY - Continuous slow, generalized IMPRESSION: This technically difficult study is suggestive of moderate diffuse encephalopathy, nonspecific etiology. No seizures or epileptiform discharges were seen throughout the recording. Priyanka Barbra Sarks        Scheduled Meds:   enoxaparin (LOVENOX) injection  40 mg Subcutaneous Q24H   potassium chloride  40 mEq Oral Once   Continuous Infusions:  ampicillin-sulbactam (UNASYN) IV 3 g (07/27/21 0954)   azithromycin 500 mg (07/27/21 1041)   levETIRAcetam 500 mg (07/27/21 3545)   sodium chloride       LOS: 1 day    Time spent: 35 minutes    Sidney Ace, MD [facility]   If 7PM-7AM, please contact night-coverage  07/27/2021, 10:43 AM

## 2021-07-27 NOTE — Progress Notes (Signed)
Clinical/Bedside Swallow Evaluation Patient Details  Name: Robin Archer MRN: [id number] Date of Birth: 03/10/1946  Today's Date: 07/27/2021 Time: SLP Start Time (ACUTE ONLY): 1055 SLP Stop Time (ACUTE ONLY): 1130 SLP Time Calculation (min) (ACUTE ONLY): 35 min  Past Medical History:  Past Medical History:  Diagnosis Date   DM (diabetes mellitus) (HCC)    GERD (gastroesophageal reflux disease)    Hyperlipidemia    Hypertension    Memory loss    Myasthenia gravis (HCC)    Spinal stenosis    Vitamin B deficiency    Past Surgical History:  Past Surgical History:  Procedure Laterality Date   CATARACT EXTRACTION     HPI:  Patient is a 75 y.o. female with PMH of myasthenia gravis, dementia (AD and vascular), HTN, DM, HLD who presented with facial twitching concerning for seizures. She had an episode of emesis in the ED; suspected early aspiration on chest CT. Per family, pt has not communicated verbally in several years, does not follow commands, but is alert and responds to y/n questions by nodding and shaking her head.    Assessment / Plan / Recommendation  Clinical Impression   Patient presents with moderate-severe risk for aspiration in context of chronic health conditions impacting swallow function including dementia and myasthenia gravis, altered mentation, and possible developing pneumonia which may impact pulmonary status. Patient required cues/stimulation for alertness (warm washcloth, repositioning, oral care). Unable to follow commands. Nodded her head yes and no in response to questions, however she is also frequently nodding her head "no". Allowed minimal oral care (exterior), with clenched teeth which she opened only briefly with max A. Thermal/tactile stimulation with ice chips, pt refused all attempts for PO trials even when encouraged by family, shaking her head "no" repeatedly. Discussed aspiration risk with family as well as natural disease progression and how this may  impact pt's ability/desire to take POs. Palliative care consult is pending. Recommend NPO for now with frequent oral care.   Addendum 2:11 PM: RN informed pt following commands and Yale swallow screen was completed; pt manipulated and swallowed ice chip, but coughed with liquids. Attempted visit for reassessment however pt again lethargic with no family present. Consider allowing ice chips for comfort after oral care, SLP to reassess tomorrow.   SLP Visit Diagnosis: Dysphagia, unspecified (R13.10)    Aspiration Risk  Moderate aspiration risk;Severe aspiration risk    Diet Recommendation NPO   Medication Administration: Via alternative means    Other  Recommendations Recommended Consults: Other (Comment) (agree with palliative consult, which is pending) Oral Care Recommendations: Oral care QID    Recommendations for follow up therapy are one component of a multi-disciplinary discharge planning process, led by the attending physician.  Recommendations may be updated based on patient status, additional functional criteria and insurance authorization.  Follow up Recommendations Other (comment) (tbd)      Assistance Recommended at Discharge    Functional Status Assessment    Frequency and Duration min 2x/week  2 weeks       Prognosis Prognosis for Safe Diet Advancement: Guarded Barriers to Reach Goals: Cognitive deficits;Other (Comment) (refusing POs)      Swallow Study   General Date of Onset: 07/27/21 HPI: Patient is a 75 y.o. female with PMH of myasthenia gravis, dementia (AD and vascular), HTN, DM, HLD who presented with facial twitching concerning for seizures. She had an episode of emesis in the ED; suspected early aspiration on chest CT. Per family, pt has not communicated verbally  in several years, does not follow commands, but is alert and responds to y/n questions by nodding and shaking her head. Type of Study: Bedside Swallow Evaluation Previous Swallow Assessment: none  on record; per family pt worked with SLP at facility but not recently. Eats puree/thin liquids at facility Diet Prior to this Study: Dysphagia 1 (puree);Thin liquids Temperature Spikes Noted: Yes (100.1) Respiratory Status: Room air History of Recent Intubation: No Behavior/Cognition: Requires cueing;Doesn't follow directions;Uncooperative Oral Cavity Assessment: Other (comment) (chunks of food/?vomit? removed during oral care) Oral Care Completed by SLP: Yes Oral Cavity - Dentition: Adequate natural dentition Self-Feeding Abilities: Total assist Patient Positioning: Upright in bed Baseline Vocal Quality: Not observed Volitional Cough: Cognitively unable to elicit Volitional Swallow: Unable to elicit    Oral/Motor/Sensory Function Overall Oral Motor/Sensory Function: Other (comment) (unable to assess due to inability to follow commands)   Ice Chips Ice chips:  (attempted, pt refused)   Thin Liquid Thin Liquid:  (attempted, pt refused)    Nectar Thick Nectar Thick Liquid: Not tested   Honey Thick Honey Thick Liquid: Not tested   Puree Puree: Not tested   Solid     Solid: Not tested      Arlana Lindau 07/27/2021,12:52 PM   Rondel Baton, MS, CCC-SLP Speech-Language Pathologist

## 2021-07-27 NOTE — TOC Initial Note (Signed)
Transition of Care [facility]) - Initial/Assessment Note    Patient Details  Name: Robin Archer MRN: [id number] Date of Birth: [birth year]  [facility] CM/SW Contact:    Caryn Section, RN Phone Number: 07/27/2021, 2:17 PM  Clinical Narrative:      Patient is from [facility], [place], [place].  As per Rickey in admissions, patient is able to return on discharge.  TOC to follow.                   Patient Goals and CMS Choice        Expected Discharge Plan and Services                                                Prior Living Arrangements/Services                       Activities of Daily Living Home Assistive Devices/Equipment: None ADL Screening (condition at time of admission) Patient's cognitive ability adequate to safely complete daily activities?: No Is the patient deaf or have difficulty hearing?: No Does the patient have difficulty seeing, even when wearing glasses/contacts?: No Does the patient have difficulty concentrating, remembering, or making decisions?: Yes Patient able to express need for assistance with ADLs?: No Does the patient have difficulty dressing or bathing?: Yes Independently performs ADLs?: No Communication: Dependent Is this a change from baseline?: Pre-admission baseline Dressing (OT): Dependent Is this a change from baseline?: Pre-admission baseline Grooming: Dependent Is this a change from baseline?: Pre-admission baseline Feeding: Dependent Is this a change from baseline?: Pre-admission baseline Bathing: Dependent Is this a change from baseline?: Pre-admission baseline Toileting: Dependent Is this a change from baseline?: Pre-admission baseline In/Out Bed: Dependent Is this a change from baseline?: Pre-admission baseline Walks in Home: Dependent Is this a change from baseline?: Pre-admission baseline Does the patient have difficulty walking or climbing stairs?: Yes Weakness of Legs:  Both Weakness of Arms/Hands: Both  Permission Sought/Granted                  Emotional Assessment              Admission diagnosis:  Pneumonia [J18.9] Seizure-like activity (HCC) [R56.9] AMS (altered mental status) [R41.82] Patient Active Problem List   Diagnosis Date Noted   Altered mental state 07/26/2021   Seizures (HCC) 07/26/2021   Pneumonia 07/26/2021   AMS (altered mental status) 07/26/2021   Mixed Alzheimer's and vascular dementia (HCC) 05/26/2015   Acute encephalopathy 04/15/2015   Dementia (HCC) 04/15/2015   Right humeral fracture 04/15/2015   Fracture of left clavicle 04/15/2015   Tachycardia 04/15/2015   HTN (hypertension) 04/15/2015   Myasthenia gravis (HCC) 04/15/2015   Type II diabetes mellitus (HCC) 04/15/2015   GERD (gastroesophageal reflux disease) 04/15/2015   Onychomycosis due to dermatophyte 02/19/2014   PCP:  Keane Police, MD Pharmacy:  No Pharmacies Listed    Social Determinants of Health (SDOH) Interventions    Readmission Risk Interventions No flowsheet data found.

## 2021-07-27 NOTE — Consult Note (Signed)
Neurology Consultation  Reason for Consult: AMS, seizure Referring Physician: Dr C. Hall, Hospitalist  CC: AMS, seizure  History is obtained from: chart review, patient unable to provide history   HPI: Robin Archer is a 75 y.o. female PMH of DM, HTN, HLD, myasthenia gravis, dementia (AD and vascular dementia), spinal stenosis, who has increased tone/contractures noted in outpatient visit from [facility]rology visit of [facility] [place], presented to the ER from a SNF, with complaints of facial twitching with concern for seizures. She also was tachycardic at the facility per report. She is unable to provide any history - non-verbal.  Admission HPI notes that she has spontaneous eye opening and random speech but is essentially bed bound needing 24/7 care. She was noted to be tachycardic, hypotensive in the ER with rhythmic facial/neck twitching. CT head negative, CTA chest negative for PE but concerning for early aspiration pneumonia.Started on keppra. EEG ordered - no seizures, slowing only.  Neurology consult obtained for AMS, seizures with given advanced dementia and MG.  She does not have leukocytosis but continues to have lactic acidosis.  Procalcitonin normal.  Also had mild troponinemia. Urinalysis unremarkable.  Chest x-ray without evidence of a pneumonia although CT angiogram of the chest done to look at PE was concerning for early aspiration pneumonia.   Later on I was able to speak with the daughter Ms. Robin Archer who reports that the patient barely opens her eyes, has been nonverbal for many many months and requires 24/7 care and has extremely poor quality of life.  ROS; Unable to obtain due to altered mental status.   Past Medical History:  Diagnosis Date   DM (diabetes mellitus) (HCC)    GERD (gastroesophageal reflux disease)    Hyperlipidemia    Hypertension    Memory loss    Myasthenia gravis (HCC)    Spinal stenosis    Vitamin B deficiency    mRS: 5-Severe  disability-bedridden, incontinent, needs constant attention   History reviewed. No pertinent family history.   Social History:   reports that she has never smoked. She does not have any smokeless tobacco history on file. She reports that she does not drink alcohol and does not use drugs.  Medications  Current Facility-Administered Medications:    acetaminophen (TYLENOL) suppository 650 mg, 650 mg, Rectal, Q4H PRN, Jimmye Norman, NP, 650 mg at 07/27/21 1941   acetaminophen (TYLENOL) tablet 650 mg, 650 mg, Oral, Q6H PRN, [place], Hubbard Hartshorn, NP   Ampicillin-Sulbactam (UNASYN) 3 g in sodium chloride 0.9 % 100 mL IVPB, 3 g, Intravenous, Q6H, Ronnald Ramp, RPH, Last Rate: 200 mL/hr at 07/27/21 0412, 3 g at 07/27/21 0412   azithromycin (ZITHROMAX) 500 mg in sodium chloride 0.9 % 250 mL IVPB, 500 mg, Intravenous, Daily, Irena Cords V, MD, Stopped at 07/26/21 0943   enoxaparin (LOVENOX) injection 40 mg, 40 mg, Subcutaneous, Q24H, Hall, Carole N, DO, 40 mg at 07/26/21 1524   labetalol (NORMODYNE) injection 10 mg, 10 mg, Intravenous, Q2H PRN, Jimmye Norman, NP, 10 mg at 07/27/21 0527   levETIRAcetam (KEPPRA) IVPB 500 mg/100 mL premix, 500 mg, Intravenous, Q12H, Irena Cords V, MD, Last Rate: 400 mL/hr at 07/27/21 0613, 500 mg at 07/27/21 0613   LORazepam (ATIVAN) injection 2 mg, 2 mg, Intravenous, Q6H PRN, Gertha Calkin, MD   magnesium sulfate IVPB 2 g 50 mL, 2 g, Intravenous, Once, Sreenath, Sudheer B, MD   ondansetron (ZOFRAN) injection 4 mg, 4 mg, Intravenous, Q6H PRN, Webb Silversmith  Achieng, NP   potassium chloride SA (KLOR-CON) CR tablet 40 mEq, 40 mEq, Oral, Once, Sreenath, Sudheer B, MD   sodium chloride 0.9 % bolus 500 mL, 500 mL, Intravenous, Once, Shaune Pollack, MD   Exam: Current vital signs: BP (!) 155/73   Pulse 100   Temp 98.8 F (37.1 C) (Oral)   Resp 18   Wt 46 kg   SpO2 100%   BMI 15.42 kg/m  Vital signs in last 24 hours: Temp:  [98.7 F (37.1  C)-102.6 F (39.2 C)] 98.8 F (37.1 C) (11/16 0830) Pulse Rate:  [97-127] 100 (11/16 0830) Resp:  [11-20] 18 (11/16 0830) BP: (122-196)/(63-136) 155/73 (11/16 0830) SpO2:  [95 %-100 %] 100 % (11/16 0830)  General: Extremely cachectic looking woman sleeping in bed HEENT: Normocephalic/atraumatic CVS: Regular rhythm Respiratory: Chest clear Abdomen nondistended nontender Extremities with contractures and atrophy of small muscles of the hand Neurological exam Laying in bed, eyes closed, does not open eyes to voice or noxious stimulation. To noxious simulation only grimaces Somewhat resistant to eye opening. Pupils are equal round reactive to light No gaze preference or deviation Face appears symmetric No spontaneous movement To noxious stimulation-grimaces There is significantly increased tone in the right lower extremity in comparison to the left lower extremity.  Tone is increased in general all over but the right leg seems to have the worst increase in tone.   Labs I have reviewed labs in epic and the results pertinent to this consultation are:  CBC    Component Value Date/Time   WBC 9.2 07/27/2021 0520   RBC 4.08 07/27/2021 0520   HGB 11.4 (L) 07/27/2021 0520   HGB 10.2 (L) 09/30/2014 2016   HCT 34.1 (L) 07/27/2021 0520   HCT 31.5 (L) 09/30/2014 2016   PLT 237 07/27/2021 0520   PLT 371 09/30/2014 2016   MCV 83.6 07/27/2021 0520   MCV 85 09/30/2014 2016   MCH 27.9 07/27/2021 0520   MCHC 33.4 07/27/2021 0520   RDW 13.5 07/27/2021 0520   RDW 14.0 09/30/2014 2016   LYMPHSABS 1.1 07/27/2021 0520   LYMPHSABS 1.4 03/29/2014 1454   MONOABS 0.7 07/27/2021 0520   MONOABS 0.9 03/29/2014 1454   EOSABS 0.0 07/27/2021 0520   EOSABS 0.0 03/29/2014 1454   BASOSABS 0.0 07/27/2021 0520   BASOSABS 0.0 03/29/2014 1454    CMP     Component Value Date/Time   NA 139 07/27/2021 0520   NA 137 09/30/2014 2016   K 3.6 07/27/2021 0520   K 3.7 09/30/2014 2016   CL 105 07/27/2021  0520   CL 100 09/30/2014 2016   CO2 24 07/27/2021 0520   CO2 27 09/30/2014 2016   GLUCOSE 193 (H) 07/27/2021 0520   GLUCOSE 114 (H) 09/30/2014 2016   BUN 13 07/27/2021 0520   BUN 12 09/30/2014 2016   CREATININE 0.75 07/27/2021 0520   CREATININE 0.96 09/30/2014 2016   CALCIUM 8.2 (L) 07/27/2021 0520   CALCIUM 8.7 09/30/2014 2016   PROT 6.6 07/27/2021 0520   PROT 6.6 09/30/2014 2016   ALBUMIN 3.7 07/27/2021 0520   ALBUMIN 3.4 09/30/2014 2016   AST 23 07/27/2021 0520   AST 11 (L) 09/30/2014 2016   ALT 10 07/27/2021 0520   ALT 14 09/30/2014 2016   ALKPHOS 52 07/27/2021 0520   ALKPHOS 60 09/30/2014 2016   BILITOT 0.9 07/27/2021 0520   BILITOT 0.2 09/30/2014 2016   GFRNONAA >60 07/27/2021 0520   GFRNONAA >60 09/30/2014 2016   GFRNONAA  52 (L) 03/29/2014 1454   GFRAA >60 04/16/2015 0516   GFRAA >60 09/30/2014 2016   GFRAA >60 03/29/2014 1454   EEG-slowing.  No seizures  Imaging I have reviewed the images obtained CT head with chronic white matter disease and asymmetric atrophy involving left hemisphere more than right hemisphere likely sequela of prior stroke.  Moderate to severe generalized atrophy noted.  Assessment:  New onset facial twitching with concerns for seizures-in a patient with a known history of severe Alzheimer's and mixed vascular dementia along with other comorbidities. Extremely poor quality of life at baseline according to the family.  Completely nonverbal and bedbound with increased tone in all extremities at baseline. Likely worsened functional status, on top of an extremely poor functional baseline status due to aspiration pneumonia. If no other clear source explains her fevers and sepsis kind of picture, and a CNS source of infection should be looked at but with her amount of disability, I do not think that is likely to change the neurological outcome in any meaningful way. Palliative/hospice with focus on comfort in my opinion should be the step going forward  in this patient's care.  Recommendations: Continue Keppra for now I spoke with the daughter Ms. Robin Archer over the phone.  She reports extremely poor quality of life for the patient over the past few months to a year. Given the extreme poor quality of life, I would be hesitant to recommend aggressive interventions such as a spinal tap to look for CNS source of infection when given her mentation, and aspiration event is more likely to explain some change but in either case it does not change her long-term prognosis. I think a palliative care/hospice consult might be in order here. Discussed my plan with the primary hospitalist Dr Georgeann Oppenheim. I will be available as needed.  -- Milon Dikes, MD Neurologist [facility] Pager: [phone]

## 2021-07-27 NOTE — Progress Notes (Signed)
Initial Nutrition Assessment  DOCUMENTATION CODES:   Underweight, Severe malnutrition in context of chronic illness  INTERVENTION:   -RD will follow for diet advancement and follow-up with further recommendations based upon goals of care  NUTRITION DIAGNOSIS:   Severe Malnutrition related to chronic illness (myasthenia gravis) as evidenced by mild fat depletion, severe fat depletion, moderate muscle depletion, severe muscle depletion.  GOAL:   Patient will meet greater than or equal to 90% of their needs  MONITOR:   Diet advancement, Labs, Weight trends, Skin, I & O's  REASON FOR ASSESSMENT:   Low Braden    ASSESSMENT:   Robin Archer is a 75 y.o. female seen in ed with complaints of status and facial twitching with concerns of seizure.  Patient at baseline is bedbound and is nonverbal.  Has spontaneous eye opening and speech random but is noncommunicative for the most part.  Per report patient has been not at her baseline.  And patient found to be extremely tachycardic and therefore brought to the emergency room with her most forms in place.  Pt admitted with altered mental status and possible seizures.   Reviewed I/O's: -198 ml x 24 hours and +946 ml since admission  UOP: 650 ml x 24 hours  Per SLP note, pt unsafe for PO intake currently. She remains NPO.   Pt unable to provide history due to lethargy. No family at bedside. Noted pt resides at [facility].   Per MD notes, recommending comfort care. Palliative care consult pending to discuss goals of care.   Reviewed wt hx; wt has been stable over the past month. Pt with history of distant weight loss.    Medications reviewed and include 0.9% NaCl with KCl 20 mEq/L infusion @ 75 ml/hr.   Lab Results  Component Value Date   HGBA1C 6.2 (H) 04/15/2015   PTA DM medications are 8 units insulin detemir at bedtime and 10 units insulin regular BID.   Labs reviewed. Inpatient orders for glycemic control are none.  NUTRITION -  FOCUSED PHYSICAL EXAM:  Flowsheet Row Most Recent Value  Orbital Region Mild depletion  Upper Arm Region Severe depletion  Thoracic and Lumbar Region Severe depletion  Buccal Region Mild depletion  Temple Region Mild depletion  Clavicle Bone Region Severe depletion  Clavicle and Acromion Bone Region Severe depletion  Scapular Bone Region Severe depletion  Dorsal Hand Severe depletion  Patellar Region Severe depletion  Anterior Thigh Region Severe depletion  Posterior Calf Region Severe depletion  Edema (RD Assessment) None  Hair Reviewed  Eyes Reviewed  Mouth Reviewed  Skin Reviewed  Nails Reviewed       Diet Order:   Diet Order     None       EDUCATION NEEDS:   Not appropriate for education at this time  Skin:  Skin Assessment: Reviewed RN Assessment  Last BM:  Unknown  Height:   Ht Readings from Last 1 Encounters:  06/30/21 5\' 8"  (1.727 m)    Weight:   Wt Readings from Last 1 Encounters:  07/25/21 46 kg    Ideal Body Weight:  63.6 kg  BMI:  Body mass index is 15.42 kg/m.  Estimated Nutritional Needs:   Kcal:  1650-1850  Protein:  80-95 grams  Fluid:  > 1.6 L    07/27/21, RD, LDN, CDCES Registered Dietitian II Certified Diabetes Care and Education Specialist Please refer to [facility] for RD and/or RD on-call/weekend/after hours pager

## 2021-07-27 NOTE — Progress Notes (Signed)
Sepsis tracking by eLINK 

## 2021-07-28 DIAGNOSIS — T17908D Unspecified foreign body in respiratory tract, part unspecified causing other injury, subsequent encounter: Secondary | ICD-10-CM

## 2021-07-28 DIAGNOSIS — G7000 Myasthenia gravis without (acute) exacerbation: Secondary | ICD-10-CM | POA: Diagnosis not present

## 2021-07-28 DIAGNOSIS — R569 Unspecified convulsions: Secondary | ICD-10-CM

## 2021-07-28 DIAGNOSIS — E43 Unspecified severe protein-calorie malnutrition: Secondary | ICD-10-CM | POA: Insufficient documentation

## 2021-07-28 DIAGNOSIS — Z66 Do not resuscitate: Secondary | ICD-10-CM

## 2021-07-28 DIAGNOSIS — R4182 Altered mental status, unspecified: Secondary | ICD-10-CM | POA: Diagnosis not present

## 2021-07-28 DIAGNOSIS — G309 Alzheimer's disease, unspecified: Secondary | ICD-10-CM | POA: Diagnosis not present

## 2021-07-28 DIAGNOSIS — Z515 Encounter for palliative care: Secondary | ICD-10-CM

## 2021-07-28 DIAGNOSIS — F02C0 Dementia in other diseases classified elsewhere, severe, without behavioral disturbance, psychotic disturbance, mood disturbance, and anxiety: Secondary | ICD-10-CM

## 2021-07-28 LAB — PROCALCITONIN: Procalcitonin: <0.1 ng/mL

## 2021-07-28 NOTE — Consult Note (Signed)
Consultation Note Date: 07/28/2021   Patient Name: Robin Archer  DOB: 12-12-1945  MRN: [id number]  Age / Sex: 75 y.o., female  PCP: Robin Morin, MD Referring Physician: Sidney Ace, MD  Reason for Consultation: Establishing goals of care  HPI/Patient Profile: 75 y.o. female  with past medical history of Alzheimer's dementia, myasthenia gravis, spinal stenosis, HTN, diabetes type 2, and GERD admitted on 07/25/2021 with concerns for seizures due to facial twitching.  Subsequently found to have early aspiration pneumonia.  Patient placed on Keppra and IV antibiotics.  Neuro was consulted and they do not recommend aggressive care at this time.    At baseline patient is bedbound and nonverbal.  Patient has severe muscle mass loss.  Patient is a longtime resident of a nursing facility that endorses she was eating 25% of her meals prior to admission.  Patient has refused to eat or drink while hospitalized.  Palliative medicine was consulted to discuss goals of care.  Clinical Assessment and Goals of Care: I have reviewed medical records including EPIC notes, labs and imaging, assessed the patient and then met with patient and her two daughters Robin Archer and Robin Archer at bedside to discuss diagnosis prognosis, GOC, EOL wishes, disposition and options.  I introduced Palliative Medicine as specialized medical care for people living with serious illness. It focuses on providing relief from the symptoms and stress of a serious illness. The goal is to improve quality of life for both the patient and the family.  We discussed a brief life review of the patient.  Her daughters describe her is on the go.  She cared for her grandson from age 21 weeks until he went to kindergarten.  She also worked for a brief time in a warehouse.  As far as functional and nutritional status prior to admission patient was  bedbound and nonverbal.  During our discussions patient's daughter Robin Archer spoke with the nursing facility that confirmed patient was eating 25% of meals and refusing all medications over the last month.  We discussed patient's current illness and what it means in the larger context of patient's on-going co-morbidities.  Natural disease trajectory and expectations at EOL were discussed. I attempted to elicit values and goals of care important to the patient.  Both daughter shared that they do not want her to suffer.  Daughter Robin Archer would like for the patient return to her skilled nursing facility because she is afraid that if she does not go there then she will go to a hospice home and die.  Robin Archer shares that she did not recognize that her mother's dementia had progressed so significantly.  Both daughter seem to be in shock that their mother has declined to the state.  The difference between aggressive medical intervention and comfort care was considered in light of the patient's goals of care.  Artificial hydration and nutrition was reviewed.  Risks and benefits associated with feeding tube placement discussed.  Daughters were in agreement not to place a PEG tube.  According to MOST form  completed in 2018, patient would like a trial of IV fluid, no PEG tube placement, comfort care measures only, and use of IV antibiotics when appropriate.  With the exception of comfort care, the patient's current plan of care is in line with the patient's wishes as outlined in her MOST form.  The patient's 2 daughters were appropriately tearful.  Therapeutic space and silence provided for daughters to process these emotions.  Discussed that dementia is a progressive and chronic disease that is not reversible.  Patient's daughter Robin Archer asked what would happen if the patient does not eat or drink.  I was honest and shared that the patient will eventually pass away.  I outlined that she would not be starving to death.  I  shared that cachexia is a multifactorial process the body undergoes to attempt to manage end-of-life body processes.  For now the daughters would like to continue with IV fluids and see how the patient responds.  The patient has been nonverbal for a significantly long amount of time.  However, both daughters were present when the patient apparently verbalized their names this morning.  Both daughters recognize that IV fluid is a temporary fix and is prolonging her life.  However, they would like to continue them for now and see how their mother does over the next 24 to 48 hours.  Both sisters were appreciative of our discussions.  They shared they have a brother who is not able to be physically present in the hospital because it is too upsetting for him.  I offered that I am free to speak with him over the phone should he want to.  I received a message that the patient's son Robin Archer would like to speak with me.  I spoke with Robin Archer over the phone and outlined to the above-mentioned discussion.  He was appreciative of the update.  He said this is a terrible time for him to lose his mother since the holidays are coming up.  I also offered him therapeutic silence and space to share his emotions and process these feelings.  Hospice services in home versus hospice home were explained. Discussed with patient/family the importance of continued conversation with family and the medical providers regarding overall plan of care and treatment options, ensuring decisions are within the context of the patient's values and GOCs.    Questions and concerns were addressed. The family was encouraged to call with questions or concerns.   I shared that I will be on service tomorrow we will continue to monitor and round on the patient.  Primary Decision Maker NEXT OF KIN  Code Status/Advance Care Planning: DNR  Prognosis:   Unable to determine  Discharge Planning: To Be Determined  Primary Diagnoses: Present on  Admission:  GERD (gastroesophageal reflux disease)  HTN (hypertension)  Myasthenia gravis ([place])  Altered mental state  AMS (altered mental status)   Physical Exam Vitals and nursing note reviewed.  Constitutional:      General: She is not in acute distress.    Appearance: She is not ill-appearing.  HENT:     Head: Normocephalic and atraumatic.     Mouth/Throat:     Mouth: Mucous membranes are moist.  Cardiovascular:     Rate and Rhythm: Normal rate.     Pulses: Normal pulses.  Pulmonary:     Effort: Pulmonary effort is normal.  Abdominal:     Palpations: Abdomen is soft.  Genitourinary:    Comments: Purewick in place Musculoskeletal:  Comments: Bedbound, UE and LE contractures  Skin:    General: Skin is warm and dry.  Neurological:     Comments: nonverbal    Vital Signs: BP (!) 156/62 (BP Location: Left Arm)   Pulse 75   Temp 98.7 F (37.1 C) (Oral)   Resp 18   Wt 46 kg   SpO2 100%   BMI 15.42 kg/m  Pain Scale: PAINAD     SpO2: SpO2: 100 % O2 Device:SpO2: 100 % O2 Flow Rate: .   Palliative Assessment/Data:     I discussed this patient's plan of care with patient's two daughters and son, SLP therapist Belenda Cruise.  Thank you for this consult. Palliative medicine will continue to follow and assist holistically.   Time Total: 70 minutes Greater than 50%  of this time was spent counseling and coordinating care related to the above assessment and plan.  Signed by: Jordan Hawks, DNP, FNP-BC Palliative Medicine    Please contact Palliative Medicine Team phone at [phone] for questions and concerns.  For individual provider: See Shea Evans

## 2021-07-28 NOTE — Progress Notes (Signed)
Speech Language Pathology Treatment: Dysphagia  Patient Details Name: Robin Archer MRN: [id number] DOB: 23-Jan-1946 Today's Date: 07/28/2021 Time: 9924-2683 SLP Time Calculation (min) (ACUTE ONLY): 40 min  Assessment / Plan / Recommendation Clinical Impression  Pt seen for ongoing assessment of swallowing. She was alert/awake but nonverbal except to say her Dtrs' names x1; she often looked toward speaker and smiled but only nodded intermittently. Pt is on RA; wbc wnl. Native Dentition but pt would not participate nor allow oral care by SLP.  Pt and Dtrs explained general aspiration precautions and agreed verbally to the need for following them especially sitting upright for all oral intake -- supported behind the back for more upright sitting, although challenged d/t pt's Contracted Legs in bed.  Pt presents w/ risk for aspiration in context of declining, chronic health conditions impacting swallow function including Dementia and myasthenia gravis and possible developing pneumonia which may impact pulmonary status. Patient required MOD-MAX cues to encourage acceptance of po trials -- Dtrs also attempted feeding of po trials. Pt nodded her head yes then no in response to offers of po's. When presented ice chips at mouth/lips, she accepted 1, then a 2nd trial which Dtr fed to her. A pharyngeal swallow was appreciated x1 w/ no overt coughing following. Pt then accepted 1 tsp of applesauce fed to her by Dtr BUT expectorated it making facial gestures of aversion. She then refused all further attempts of po trials even when offered ice cream, pudding, and juice as well as w/ encouragement by family -- she shook her head "no" repeatedly and pulled away from offer of spoon. She was no longer smiling.   Discussed dysphagia in light of Cognitive decline, aspiration risk as well as natural disease progression and how this may impact pt's ability/desire to take po's. Palliative care consult pending but suggested  discussion of pt's presentation w/ the NP. Recommend continue NPO for now with frequent oral care for hygiene and stimulation of swallowing. ST services will f/u tomorrow for attempts at po's. Dtrs agreed.      HPI HPI: Patient is a 75 y.o. female with PMH of myasthenia gravis, dementia (AD and vascular), HTN, DM, HLD who presented with facial twitching concerning for seizures. She had an episode of emesis in the ED; suspected early aspiration on chest CT. Per family, pt has not communicated verbally in several years, does not follow commands, but is alert and responds to y/n questions by nodding and shaking her head at times. She has had recent decline in at her NH.      SLP Plan  Continue with current plan of care      Recommendations for follow up therapy are one component of a multi-disciplinary discharge planning process, led by the attending physician.  Recommendations may be updated based on patient status, additional functional criteria and insurance authorization.    Recommendations  Diet recommendations: NPO (w/ po trials to encourage oral intake when ready) Medication Administration: Via alternative means                General recommendations:  (Palliative Care following) Oral Care Recommendations: Oral care QID;Staff/trained caregiver to provide oral care Follow Up Recommendations: Skilled nursing-short term rehab (<3 hours/day) Assistance recommended at discharge: Frequent or constant Supervision/Assistance SLP Visit Diagnosis: Dysphagia, oropharyngeal phase (R13.12) (Cognitive decline - Dementia) Plan: Continue with current plan of care       GO  Jerilynn Som, MS, CCC-SLP Speech Language Pathologist Rehab Services [phone] [facility]  07/28/2021, 3:53 PM

## 2021-07-28 NOTE — Progress Notes (Signed)
PROGRESS NOTE    Robin Archer  EZM:[id number] DOB: 11-12-1945 DOA: 07/25/2021 PCP: Alvester Morin, MD    Brief Narrative:  75 y.o. female with past medical history significant for Alzheimer's dementia, myasthenia gravis, spinal stenosis, hypertension, type 2 diabetes, GERD, who presented to [facility] ED with complaints of facial twitching with concerns of seizure.  Patient at baseline is bedbound and is nonverbal.  Has spontaneous eye opening and is noncommunicative for the most part.  Per report patient has not been at her baseline.  Per her daughter at bedside she has been very somnolent recently.  Upon arrival to the ED, patient was tachycardic, hypotensive, with rhythmic twitching of her face and contraction of the neck, reported by EDP with concern for new onset seizure activity.  Work-up in the ED showed negative head CT and negative UA, no evidence of pulmonary embolism on CT angio chest, suspected early aspiration on CT scan.  EEG ordered and pending.  She was started on IV Keppra and empiric antibiotics for aspiration pneumonia.  Admitted by hospitalist service.   07/26/2021: Patient was seen and examined at her bedside in the ED, she is minimally responsive.  Had an episode of emesis while in the ED. 11/16: No appreciable status changes.  Started on Unasyn and azithromycin for presumed aspiration pneumonia and resultant sepsis.  Discussed care plan with neurology.  Patient with poor quality of life and declining over the past several months. 11/17: More awake today.  Remains verbally unresponsive.  No further seizure activity noted.  Palliative care consulted, recommendations appreciated.  Discussion with daughter at bedside today regarding overall patient's status and pending plan of care evaluation.   Assessment & Plan:   Principal Problem:   Altered mental state Active Problems:   HTN (hypertension)   Myasthenia gravis (HCC)   Type II diabetes mellitus (HCC)   GERD  (gastroesophageal reflux disease)   Seizures (HCC)   Pneumonia   AMS (altered mental status)   Protein-calorie malnutrition, severe  Aspiration pneumonia Acute metabolic encephalopathy secondary to above Severe sepsis secondary to aspiration Sepsis criteria met with tachycardia and fever.  Severe sepsis criteria met with elevated lactic acid Presumed source aspiration Sepsis physiology improved Plan: Continue Unasyn Completed 3-day course of azithromycin, will discontinue Continue IV fluids Follow cultures Monitor vitals and fever curve  Twitching activity in ED, possible new onset seizure Neurology consulted EEG negative Plan: Continue Keppra for now Can consider lumbar tap to evaluate CNS source of infection however unlikely to change overall outcome as patient's quality of life and functional status very poor baseline Palliative consult, pending  Elevated troponin suspect supply demand ischemia No evidence of ACS   Hypokalemia Monitor and replace as necessary   Hypomagnesemia Monitor and replace as necessary   Essential hypertension Continue IV antihypertensives while NPO   Myasthenia gravis Not on treatment   Physical debility/generalized weakness Adult failure to thrive Severe protein calorie malnutrition BMI 15 Severe muscle mass loss SLP evaluation, recommend n.p.o. for now Plan: Palliative consultation, pending RD follow-up SLP follow-up   DVT prophylaxis: SQ Lovenox Code Status: DNR Family Communication: Daughter [name] [phone] on 11/16, at bedside 11/17 Disposition Plan: Status is: Inpatient  Remains inpatient appropriate because: Severe sepsis secondary to aspiration pneumonia.  Sepsis physiology improved.  Pending palliative care evaluation.  Disposition in 24 to 48 hours     Level of care: Telemetry Medical  Consultants:  Palliative care  Procedures:  None  Antimicrobials: Unasyn Azithromycin   Subjective: Seen and  examined.  Verbally unresponsive.  Pleasant.  Visibly no distress  Objective: Vitals:   07/27/21 2001 07/27/21 2150 07/28/21 0024 07/28/21 0429  BP: (!) 150/63  (!) 154/57 (!) 143/62  Pulse: (!) 104 100 (!) 110 96  Resp: 16  16 18  Temp: 98 F (36.7 C)  99.5 F (37.5 C) 99.5 F (37.5 C)  TempSrc:      SpO2: 100%  99% 100%  Weight:       No intake or output data in the 24 hours ending 07/28/21 0618 Filed Weights   07/25/21 1831  Weight: 46 kg    Examination:  General exam: No apparent distress.  Pleasant.  Frail and chronically ill Respiratory system: Bibasilar crackles.  Normal work of breathing.  2 L Cardiovascular system: Tachycardic, regular rhythm, no murmurs, no pedal edema Gastrointestinal system: Thin/scaphoid, nontender, nondistended, decreased bowel sounds Central nervous system: Verbally unresponsive. Extremities: Diffuse muscle wasting bilaterally.  Decreased power Skin: No rashes, lesions or ulcers Psychiatry: Unable to assess    Data Reviewed: I have personally reviewed following labs and imaging studies  CBC: Recent Labs  Lab 07/25/21 1851 07/27/21 0520  WBC 6.6 9.2  NEUTROABS 5.2 7.3  HGB 11.2* 11.4*  HCT 34.2* 34.1*  MCV 84.0 83.6  PLT 192 237   Basic Metabolic Panel: Recent Labs  Lab 07/25/21 1826 07/27/21 0520  NA 143 139  K 3.2* 3.6  CL 105 105  CO2 25 24  GLUCOSE 201* 193*  BUN 19 13  CREATININE 0.71 0.75  CALCIUM 9.7 8.2*  MG 1.6* 1.7  PHOS  --  2.5   GFR: Estimated Creatinine Clearance: 44.8 mL/min (by C-G formula based on SCr of 0.75 mg/dL). Liver Function Tests: Recent Labs  Lab 07/25/21 1826 07/27/21 0520  AST 20 23  ALT 11 10  ALKPHOS 64 52  BILITOT 0.6 0.9  PROT 7.4 6.6  ALBUMIN 4.1 3.7   No results for input(s): LIPASE, AMYLASE in the last 168 hours. No results for input(s): AMMONIA in the last 168 hours. Coagulation Profile: No results for input(s): INR, PROTIME in the last 168 hours. Cardiac  Enzymes: Recent Labs  Lab 07/25/21 2047  CKTOTAL 23*   BNP (last 3 results) No results for input(s): PROBNP in the last 8760 hours. HbA1C: No results for input(s): HGBA1C in the last 72 hours. CBG: No results for input(s): GLUCAP in the last 168 hours. Lipid Profile: No results for input(s): CHOL, HDL, LDLCALC, TRIG, CHOLHDL, LDLDIRECT in the last 72 hours. Thyroid Function Tests: Recent Labs    07/25/21 2047  TSH 1.448   Anemia Panel: No results for input(s): VITAMINB12, FOLATE, FERRITIN, TIBC, IRON, RETICCTPCT in the last 72 hours. Sepsis Labs: Recent Labs  Lab 07/27/21 0238 07/27/21 0520 07/27/21 0739 07/27/21 1028 07/28/21 0348  PROCALCITON <0.10  --   --   --  <0.10  LATICACIDVEN 2.2* 2.9* 2.6* 2.0*  --     Recent Results (from the past 240 hour(s))  Resp Panel by RT-PCR (Flu A&B, Covid) Nasopharyngeal Swab     Status: None   Collection Time: 07/26/21  1:15 AM   Specimen: Nasopharyngeal Swab; Nasopharyngeal(NP) swabs in vial transport medium  Result Value Ref Range Status   SARS Coronavirus 2 by RT PCR NEGATIVE NEGATIVE Final    Comment: (NOTE) SARS-CoV-2 target nucleic acids are NOT DETECTED.  The SARS-CoV-2 RNA is generally detectable in upper respiratory specimens during the acute phase of infection. The lowest concentration of SARS-CoV-2 viral copies this   assay can detect is 138 copies/mL. A negative result does not preclude SARS-Cov-2 infection and should not be used as the sole basis for treatment or other patient management decisions. A negative result may occur with  improper specimen collection/handling, submission of specimen other than nasopharyngeal swab, presence of viral mutation(s) within the areas targeted by this assay, and inadequate number of viral copies(<138 copies/mL). A negative result must be combined with clinical observations, patient history, and epidemiological information. The expected result is Negative.  Fact Sheet for  Patients:  https://www.fda.gov/media/152166/download  Fact Sheet for Healthcare Providers:  https://www.fda.gov/media/152162/download  This test is no t yet approved or cleared by the [place] FDA and  has been authorized for detection and/or diagnosis of SARS-CoV-2 by FDA under an Emergency Use Authorization (EUA). This EUA will remain  in effect (meaning this test can be used) for the duration of the COVID-19 declaration under Section 564(b)(1) of the Act, 21 U.S.C.section 360bbb-3(b)(1), unless the authorization is terminated  or revoked sooner.       Influenza A by PCR NEGATIVE NEGATIVE Final   Influenza B by PCR NEGATIVE NEGATIVE Final    Comment: (NOTE) The Xpert Xpress SARS-CoV-2/FLU/RSV plus assay is intended as an aid in the diagnosis of influenza from Nasopharyngeal swab specimens and should not be used as a sole basis for treatment. Nasal washings and aspirates are unacceptable for Xpert Xpress SARS-CoV-2/FLU/RSV testing.  Fact Sheet for Patients: https://www.fda.gov/media/152166/download  Fact Sheet for Healthcare Providers: https://www.fda.gov/media/152162/download  This test is not yet approved or cleared by the [place] FDA and has been authorized for detection and/or diagnosis of SARS-CoV-2 by FDA under an Emergency Use Authorization (EUA). This EUA will remain in effect (meaning this test can be used) for the duration of the COVID-19 declaration under Section 564(b)(1) of the Act, 21 U.S.C. section 360bbb-3(b)(1), unless the authorization is terminated or revoked.  Performed at [facility], [address] [place], [place] [address]   CULTURE, BLOOD (ROUTINE X 2) w Reflex to ID Panel     Status: None (Preliminary result)   Collection Time: 07/27/21  2:37 AM   Specimen: BLOOD  Result Value Ref Range Status   Specimen Description BLOOD RIGHT ARM  Final   Special Requests   Final    BOTTLES DRAWN AEROBIC AND ANAEROBIC Blood Culture  adequate volume   Culture   Final    NO GROWTH < 12 HOURS Performed at [facility], [address] [place], [place] [address]    Report Status PENDING  Incomplete  CULTURE, BLOOD (ROUTINE X 2) w Reflex to ID Panel     Status: None (Preliminary result)   Collection Time: 07/27/21  2:39 AM   Specimen: BLOOD  Result Value Ref Range Status   Specimen Description BLOOD RIGHT HAND  Final   Special Requests   Final    BOTTLES DRAWN AEROBIC AND ANAEROBIC Blood Culture adequate volume   Culture   Final    NO GROWTH < 12 HOURS Performed at  [facility], [address] [place], [place] [address]    Report Status PENDING  Incomplete  MRSA Next Gen by PCR, Nasal     Status: None   Collection Time: 07/27/21  2:45 AM   Specimen: Nasal Swab  Result Value Ref Range Status   MRSA by PCR Next Gen NOT DETECTED NOT DETECTED Final    Comment: (NOTE) The GeneXpert MRSA Assay (FDA approved for NASAL specimens only), is one component of a comprehensive   MRSA colonization surveillance program. It is not intended to diagnose MRSA infection nor to guide or monitor treatment for MRSA infections. Test performance is not FDA approved in patients less than 2 years old. Performed at [facility], [address] [place], [place] [address]          Radiology Studies: DG Chest 1 View  Result Date: 07/27/2021 CLINICAL DATA:  Shortness of breath and possible aspiration EXAM: PORTABLE CHEST 1 VIEW COMPARISON:  07/25/2021 FINDINGS: Cardiac shadow is stable. The lungs are clear bilaterally. No infiltrate or effusion is noted. No acute bony abnormality is seen. IMPRESSION: No active disease. Electronically Signed   By: Mark  Lukens M.D.   On: 07/27/2021 02:26   EEG adult  Result Date: 07/26/2021 Yadav, Priyanka O, MD     07/26/2021  2:55 PM Patient Name: Donzella Ann Mah MRN: [id number] Epilepsy Attending: Priyanka O Yadav Referring Physician/Provider: Dr Ekta Patel Date:  07/26/2021 Duration: 21.11mins Patient history: 75-year-old female presented with facial twitching.  EEG to evaluate for seizures. Level of alertness: Awake AEDs during EEG study: LEV Technical aspects: This EEG study was done with scalp electrodes positioned according to the 10-20 International system of electrode placement. Electrical activity was acquired at a sampling rate of 500Hz and reviewed with a high frequency filter of 70Hz and a low frequency filter of 1Hz. EEG data were recorded continuously and digitally stored. Description: No posterior dominant rhythm was seen. EEG showed continuous generalized 3 to 5 Hz theta-delta slowing. Hyperventilation and photic stimulation were not performed.   Of note, EEG was technically difficult due to significant movement artifact. ABNORMALITY - Continuous slow, generalized IMPRESSION: This technically difficult study is suggestive of moderate diffuse encephalopathy, nonspecific etiology. No seizures or epileptiform discharges were seen throughout the recording. Priyanka O Yadav        Scheduled Meds:  enoxaparin (LOVENOX) injection  40 mg Subcutaneous Q24H   Continuous Infusions:  0.9 % NaCl with KCl 20 mEq / L 75 mL/hr at 07/27/21 1637   ampicillin-sulbactam (UNASYN) IV 3 g (07/28/21 0432)   azithromycin 500 mg (07/27/21 1041)   levETIRAcetam 500 mg (07/28/21 0534)     LOS: 2 days    Time spent: 25 minutes     B , MD [facility]   If 7PM-7AM, please contact night-coverage  07/28/2021, 6:18 AM   

## 2021-07-28 NOTE — Progress Notes (Signed)
Pt's son came out from his mother's room and advised me at the nurse's station that he got his mother to eat cake and drink some Gatorade

## 2021-07-29 DIAGNOSIS — G7000 Myasthenia gravis without (acute) exacerbation: Secondary | ICD-10-CM | POA: Diagnosis not present

## 2021-07-29 DIAGNOSIS — R4182 Altered mental status, unspecified: Secondary | ICD-10-CM | POA: Diagnosis not present

## 2021-07-29 DIAGNOSIS — R569 Unspecified convulsions: Secondary | ICD-10-CM | POA: Diagnosis not present

## 2021-07-29 DIAGNOSIS — E43 Unspecified severe protein-calorie malnutrition: Secondary | ICD-10-CM | POA: Diagnosis not present

## 2021-07-29 DIAGNOSIS — T17908D Unspecified foreign body in respiratory tract, part unspecified causing other injury, subsequent encounter: Secondary | ICD-10-CM | POA: Diagnosis not present

## 2021-07-29 LAB — PROCALCITONIN: Procalcitonin: <0.1 ng/mL

## 2021-07-29 MED ORDER — ENSURE ENLIVE PO LIQD
237.0000 mL | Freq: Two times a day (BID) | ORAL | Status: DC
Start: 2021-07-29 — End: 2021-08-02
  Administered 2021-07-29 – 2021-08-01 (×3): 237 mL via ORAL

## 2021-07-29 NOTE — Progress Notes (Signed)
Palliative Care Progress Note, Assessment & Plan   Patient Name: Robin Archer       Date: 07/29/2021 DOB: 1946-08-13  Age: 75 y.o. MRN#: [id number] Attending Physician: Tresa Moore, MD Primary Care Physician: Keane Police, MD Admit Date: 07/25/2021  Reason for Consultation/Follow-up: Establishing goals of care  Subjective: Patient is lying in bed to her left side in no apparent distress.  She continues to be contracted in her upper and lower extremities.  Her daughter [name] is at bedside.  HPI: 75 y.o. female  with past medical history of Alzheimer's dementia, myasthenia gravis, spinal stenosis, HTN, diabetes type 2, and GERD admitted on 07/25/2021 with concerns for seizures due to facial twitching.  Subsequently found to have early aspiration pneumonia.  Patient placed on Keppra and IV antibiotics.  Neuro was consulted and they do not recommend aggressive care at this time.     At baseline patient is bedbound and nonverbal.  Patient has severe muscle mass loss.  Patient is a longtime resident of a nursing facility that endorses she was eating 25% of her meals prior to admission.  Patient has refused to eat or drink while hospitalized until her son Loraine Leriche visited last night.  He was able to get her to eat a few bites of cake and take some sips of Gatorade.  SLP therapist notified and MD gave patient a diet.   Palliative medicine was consulted to discuss goals of care.  Summary of counseling/coordination of care: [name] is at bedside.  She is grateful to hear and see that her mother has taken a few bites of food.  However she acknowledges that she is realistic and understanding that her mother still has the progressive and chronic disease of dementia.  Discussed continuing hydration.   Discussed whether or not patient will be able to meet her nutrition and hydration needs with p.o. intake.  [name] asked if she could have palliative medicine continue to follow her mom wants her mother is discharged.  I shared that outpatient palliative services are appropriate and referral will be in place at discharge.  [name] look for guidance on whether or not to continue the IV fluids and when to take her mother to the facility.  I shared that if the goal is to perk her up and give her the best hydration possible then IV fluids could be continued until perhaps tomorrow morning.  I cautioned that we do not want to fluid overload her mother and contribute to pneumonia.  [name] shared that she would talk to her sister and brother but seems to lean towards stopping IV fluids just prior to discharging the patient back to her facility.  Human mortality, letting go of loved ones, and making hard decisions for our parents at end-of-life was discussed.  PAMI said she wanted to be selfish and keep her mother with her as long as she could but also knew that she was suffering by not being able to get up and go like she used to.  Therapeutic silence and space given to allow these feelings and emotions to be expressed.  Discussed with [name] the importance of continuing conversations with her siblings to ensure that all medical decisions  are made within the context of the patient's values, goals of care, and wanted outcomes given what we know about her health status.  Questions and concerns were addressed.  I shared that I will be off service until Monday.  The medicine team will continue to follow the patient throughout her hospitalization.  Code Status: DNR  Prognosis: Unable to determine  Discharge Planning: Return to SNF with Outpatient Palliative Services  Care plan was discussed with [name]  Physical Exam Vitals and nursing note reviewed.  Constitutional:      General: She is not in acute  distress.    Appearance: She is not ill-appearing.     Comments: Non verbal  HENT:     Head: Normocephalic and atraumatic.     Mouth/Throat:     Mouth: Mucous membranes are moist.  Cardiovascular:     Rate and Rhythm: Normal rate.     Pulses: Normal pulses.  Abdominal:     Palpations: Abdomen is soft.  Musculoskeletal:     Comments: Bedbound, UE and LE contracted  Skin:    General: Skin is warm and dry.  Neurological:     Comments: nonverbal               Total Time 25 minutes  Greater than 50%  of this time was spent counseling and coordinating care related to the above assessment and plan.  Thank you for allowing the Palliative Medicine Team to assist in the care of this patient.  Samara Deist L. Manon Hilding, FNP-BC Palliative Medicine Team Team Phone # [phone]

## 2021-07-29 NOTE — Progress Notes (Signed)
Speech Language Pathology Treatment: Dysphagia  Patient Details Name: Robin Archer MRN: [id number] DOB: [birth year] Today's Date: 07/29/2021 Time: 0920-1005 SLP Time Calculation (min) (ACUTE ONLY): 45 min  Assessment / Plan / Recommendation Clinical Impression  Pt seen for ongoing trials at bedside to assess for least restrictive, safe oral diet consistency. Per NSG report, pt ate "[place] Chocolate Cake" and drank Gatorade(thins) last night w/ Son and tolerated this well w/ no decline in status. Per overall presentation, pt appears to present w/ oropharyngeal phase dysphagia in light of declined Cognitive status; Baseline Dementia. This can impact her overall Desire for oral intake as well as awareness/timing of swallow and safety during po tasks which increases risk for aspiration, choking. Pt's risk for aspiration is present but can be reduced when following general aspiration precautions and using a modified diet consistency of soft, cut foods. She required MOD verbal/visual cues during po tasks and would benefit from Supervision w/ all oral intake d/t Cognitive decline.   Pt consumed only few trials of soft solids and thin liquids from SLP/NSG w/ No immediate, overt clinical s/s of aspiration noted; no decline in vocal quality; no cough, and no decline in respiratory status during/post trials. A mild cough x1 noted b/t trials of solids. Oral phase was grossly adequate for bolus management and oral clearing of the boluses given. Mastication Time of soft solids was min increased as pt manipulated and cleared b/t trials. Pt did not attempt self-feeding and required MAX support and guidance d/t Cognitive decline. Was able to help hold own Cup during drinking which improves safety of swallowing. Confusion w/ oral care noted.     D/t pt's Baseline, declined Cognitive status w/ Dementia and her risk for aspiration, recommend initiation of the dysphagia level 3(mech soft w/ chopped foods, gravy added to  moisten) w/ thin liquids; general aspiration precautions; reduce Distractions during meals and engage pt during po's at meal for self-feeding. Pills Crushed in Puree for safer swallowing. Full Supervision and Support w/ feeding at meals. MD/NSG updated. ST services recommends follow w/ Palliative Care for [place] and education re: impact of Cognitive decline/Dementia on swallowing. Dietician f/u for support as needed. ST services can follow pt at next venue of care if needed for ongoing education - pt appears at her Baseline from a dysphagia standpoint w/ impact from Dementia. Precautions posted in room. Education w/ Family on benefits of dysphagia diet; impact of Cognitive decline/Dementia on swallowing as well as overall Desire to eat/drink; aspiration precautions     HPI HPI: Patient is a 75 y.o. female with PMH of myasthenia gravis, dementia (AD and vascular), HTN, DM, HLD who presented with facial twitching concerning for seizures. She had an episode of emesis in the ED; suspected early aspiration on chest CT. Per family, pt has not communicated verbally in several years, does not follow commands, but is alert and responds to y/n questions by nodding and shaking her head at times. She has had recent decline in at her NH.      SLP Plan  All goals met      Recommendations for follow up therapy are one component of a multi-disciplinary discharge planning process, led by the attending physician.  Recommendations may be updated based on patient status, additional functional criteria and insurance authorization.    Recommendations  Diet recommendations: Dysphagia 3 (mechanical soft);Thin liquid Liquids provided via: Cup;Straw (monitor) Medication Administration: Crushed with puree (for safer swallowing) Supervision: Staff to assist with self feeding;Full supervision/cueing for compensatory strategies  Compensations: Minimize environmental distractions;Slow rate;Small sips/bites;Lingual sweep for  clearance of pocketing;Multiple dry swallows after each bite/sip;Follow solids with liquid Postural Changes and/or Swallow Maneuvers: Out of bed for meals;Seated upright 90 degrees;Upright 30-60 min after meal                General recommendations:  (Palliative Care and Dietician f/u) Oral Care Recommendations: Oral care BID;Oral care before and after PO;Staff/trained caregiver to provide oral care Follow Up Recommendations: Long-term institutional care without follow-up therapy (pt appears at her baseline as desribed by Dtr) Assistance recommended at discharge: Frequent or constant Supervision/Assistance SLP Visit Diagnosis: Dysphagia, oropharyngeal phase (R13.12) Plan: All goals met       [place], [place], CCC-SLP Speech Language Pathologist Rehab Services [phone]  [facility]  07/29/2021, 2:02 PM

## 2021-07-29 NOTE — Progress Notes (Signed)
Nutrition Follow-up  DOCUMENTATION CODES:   Underweight, Severe malnutrition in context of chronic illness  INTERVENTION:   -Feeding assistance with meals -Ensure Enlive po BID, each supplement provides 350 kcal and 20 grams of protein  -Magic cup TID with meals, each supplement provides 290 kcal and 9 grams of protein   NUTRITION DIAGNOSIS:   Severe Malnutrition related to chronic illness (myasthenia gravis) as evidenced by mild fat depletion, severe fat depletion, moderate muscle depletion, severe muscle depletion.  Ongoing  GOAL:   Patient will meet greater than or equal to 90% of their needs  Progressing   MONITOR:   PO intake, Supplement acceptance, Diet advancement, Labs, Weight trends, Skin, I & O's  REASON FOR ASSESSMENT:   Low Braden    ASSESSMENT:   Robin Archer is a 75 y.o. female seen in ed with complaints of status and facial twitching with concerns of seizure.  Patient at baseline is bedbound and is nonverbal.  Has spontaneous eye opening and speech random but is noncommunicative for the most part.  Per report patient has been not at her baseline.  And patient found to be extremely tachycardic and therefore brought to the emergency room with her most forms in place.  11/18- s/p BSE- advanced to dysphagia 3 diet with thin liquids  Reviewed I/O's: +1.7 L x 24 hours and +2.6 L since admission  UOP: 550 ml x 24 hours  Case discussed with SLP; pt has been advanced top a dysphagia 3 diet with thin liquids. Pt family understanding that intake is likely limited by her dementia. Pt son was able to get pt to eat some chocolate cake last night. Obtained meal order for pt (pancakes with syrup, bacon, milk, and juice) and ordered into [facility] meal ordering system.   Palliative care continues to follow for goals of care. Family does not desire a feeding tube.   Medications reviewed and include keppra and 0.9% NaCl with KCl 20 mEq/L infusion @ 75 ml/hr.   Labs  reviewed.   Diet Order:   Diet Order             DIET DYS 3 Room service appropriate? No; Fluid consistency: Thin  Diet effective now                   EDUCATION NEEDS:   Not appropriate for education at this time  Skin:  Skin Assessment: Reviewed RN Assessment  Last BM:  Unknown  Height:   Ht Readings from Last 1 Encounters:  06/30/21 5\' 8"  (1.727 m)    Weight:   Wt Readings from Last 1 Encounters:  07/25/21 46 kg    Ideal Body Weight:  63.6 kg  BMI:  Body mass index is 15.42 kg/m.  Estimated Nutritional Needs:   Kcal:  1650-1850  Protein:  80-95 grams  Fluid:  > 1.6 L    07/27/21, RD, LDN, CDCES Registered Dietitian II Certified Diabetes Care and Education Specialist Please refer to [facility] for RD and/or RD on-call/weekend/after hours pager

## 2021-07-29 NOTE — Progress Notes (Signed)
PROGRESS NOTE    Robin Archer  MRN:[id number] DOB: 09/12/1945 DOA: 07/25/2021 PCP: Slade-Hartman, Venezela, MD    Brief Narrative:  75 y.o. female with past medical history significant for Alzheimer's dementia, myasthenia gravis, spinal stenosis, hypertension, type 2 diabetes, GERD, who presented to [facility] ED with complaints of facial twitching with concerns of seizure.  Patient at baseline is bedbound and is nonverbal.  Has spontaneous eye opening and is noncommunicative for the most part.  Per report patient has not been at her baseline.  Per her daughter at bedside she has been very somnolent recently.  Upon arrival to the ED, patient was tachycardic, hypotensive, with rhythmic twitching of her face and contraction of the neck, reported by EDP with concern for new onset seizure activity.  Work-up in the ED showed negative head CT and negative UA, no evidence of pulmonary embolism on CT angio chest, suspected early aspiration on CT scan.  EEG ordered and pending.  She was started on IV Keppra and empiric antibiotics for aspiration pneumonia.  Admitted by hospitalist service.   07/26/2021: Patient was seen and examined at her bedside in the ED, she is minimally responsive.  Had an episode of emesis while in the ED. 11/16: No appreciable status changes.  Started on Unasyn and azithromycin for presumed aspiration pneumonia and resultant sepsis.  Discussed care plan with neurology.  Patient with poor quality of life and declining over the past several months. 11/17: More awake today.  Remains verbally unresponsive.  No further seizure activity noted.  Palliative care consulted, recommendations appreciated.  Discussion with daughter at bedside today regarding overall patient's status and pending plan of care evaluation. 11/18: No acute status changes over interval.  No fevers noted.  Palliative care met with patient's family yesterday   Assessment & Plan:   Principal Problem:   Altered mental  state Active Problems:   HTN (hypertension)   Myasthenia gravis (HCC)   Type II diabetes mellitus (HCC)   GERD (gastroesophageal reflux disease)   Seizures (HCC)   Pneumonia   AMS (altered mental status)   Protein-calorie malnutrition, severe  Aspiration pneumonia Acute metabolic encephalopathy secondary to above Severe sepsis secondary to aspiration Sepsis criteria met with tachycardia and fever.  Severe sepsis criteria met with elevated lactic acid Presumed source aspiration Sepsis physiology improved Plan: Continue Unasyn, limit course to 7 days max Completed 3-day course of azithromycin, will discontinue Continue IV fluids, cautious with use Follow cultures, no growth to date Monitor vitals and fever curve  Twitching activity in ED, possible new onset seizure Neurology consulted EEG negative Plan: Continue Keppra for now Can consider lumbar tap to evaluate CNS source of infection however unlikely to change overall outcome as patient's quality of life and functional status very poor baseline Palliative care evaluation on 11/17  Elevated troponin suspect supply demand ischemia No evidence of ACS   Hypokalemia Monitor and replace as necessary   Hypomagnesemia Monitor and replace as necessary   Essential hypertension Continue IV antihypertensives while NPO   Myasthenia gravis Not on treatment   Physical debility/generalized weakness Adult failure to thrive Severe protein calorie malnutrition BMI 15 Severe muscle mass loss SLP evaluation, recommend n.p.o. for now Plan: Palliative follow-up RD follow-up SLP follow-up   DVT prophylaxis: SQ Lovenox Code Status: DNR Family Communication: Daughter [name] [phone] on 11/16, at bedside 11/17, phone call on 11/18 Disposition Plan: Status is: Inpatient  Remains inpatient appropriate because: Severe sepsis secondary to aspiration pneumonia.  Sepsis physiology improved.  Pending   palliative care follow-up.   Disposition within 24 to 48 hours.  Anticipate discharge to care home with hospice versus outpatient palliative    Level of care: Telemetry Medical  Consultants:  Palliative care  Procedures:  None  Antimicrobials: Unasyn Azithromycin   Subjective: Seen and examined.  Verbally unresponsive.  Pleasant.  Visibly no distress  Objective: Vitals:   07/29/21 0136 07/29/21 0152 07/29/21 0511 07/29/21 0845  BP: (!) 153/62 (!) 158/70 (!) 144/60 (!) 163/71  Pulse: 97 91 95 86  Resp:   14 18  Temp:   99.5 F (37.5 C) 99 F (37.2 C)  TempSrc:   Oral Oral  SpO2:   100% 99%  Weight:        Intake/Output Summary (Last 24 hours) at 07/29/2021 0915 Last data filed at 07/29/2021 0559 Gross per 24 hour  Intake 2203.41 ml  Output 550 ml  Net 1653.41 ml   Filed Weights   07/25/21 1831  Weight: 46 kg    Examination:  General exam: No apparent distress.  Appears frail and chronically ill Respiratory system: Lungs clear.  Normal work of breathing.  Room air Cardiovascular system: S1-S2, regular rate and rhythm, no murmurs, no pedal edema Gastrointestinal system: Thin/scaphoid, nontender, nondistended, decreased bowel sounds Central nervous system: Verbally unresponsive. Extremities: Diffuse muscle wasting bilaterally.  Decreased power Skin: No rashes, lesions or ulcers Psychiatry: Unable to assess    Data Reviewed: I have personally reviewed following labs and imaging studies  CBC: Recent Labs  Lab 07/25/21 1851 07/27/21 0520  WBC 6.6 9.2  NEUTROABS 5.2 7.3  HGB 11.2* 11.4*  HCT 34.2* 34.1*  MCV 84.0 83.6  PLT 192 237   Basic Metabolic Panel: Recent Labs  Lab 07/25/21 1826 07/27/21 0520  NA 143 139  K 3.2* 3.6  CL 105 105  CO2 25 24  GLUCOSE 201* 193*  BUN 19 13  CREATININE 0.71 0.75  CALCIUM 9.7 8.2*  MG 1.6* 1.7  PHOS  --  2.5   GFR: Estimated Creatinine Clearance: 44.8 mL/min (by C-G formula based on SCr of 0.75 mg/dL). Liver Function  Tests: Recent Labs  Lab 07/25/21 1826 07/27/21 0520  AST 20 23  ALT 11 10  ALKPHOS 64 52  BILITOT 0.6 0.9  PROT 7.4 6.6  ALBUMIN 4.1 3.7   No results for input(s): LIPASE, AMYLASE in the last 168 hours. No results for input(s): AMMONIA in the last 168 hours. Coagulation Profile: No results for input(s): INR, PROTIME in the last 168 hours. Cardiac Enzymes: Recent Labs  Lab 07/25/21 2047  CKTOTAL 23*   BNP (last 3 results) No results for input(s): PROBNP in the last 8760 hours. HbA1C: No results for input(s): HGBA1C in the last 72 hours. CBG: No results for input(s): GLUCAP in the last 168 hours. Lipid Profile: No results for input(s): CHOL, HDL, LDLCALC, TRIG, CHOLHDL, LDLDIRECT in the last 72 hours. Thyroid Function Tests: No results for input(s): TSH, T4TOTAL, FREET4, T3FREE, THYROIDAB in the last 72 hours.  Anemia Panel: No results for input(s): VITAMINB12, FOLATE, FERRITIN, TIBC, IRON, RETICCTPCT in the last 72 hours. Sepsis Labs: Recent Labs  Lab 07/27/21 0238 07/27/21 0520 07/27/21 0739 07/27/21 1028 07/28/21 0348 07/29/21 0620  PROCALCITON <0.10  --   --   --  <0.10 <0.10  LATICACIDVEN 2.2* 2.9* 2.6* 2.0*  --   --     Recent Results (from the past 240 hour(s))  Resp Panel by RT-PCR (Flu A&B, Covid) Nasopharyngeal Swab       Status: None   Collection Time: 07/26/21  1:15 AM   Specimen: Nasopharyngeal Swab; Nasopharyngeal(NP) swabs in vial transport medium  Result Value Ref Range Status   SARS Coronavirus 2 by RT PCR NEGATIVE NEGATIVE Final    Comment: (NOTE) SARS-CoV-2 target nucleic acids are NOT DETECTED.  The SARS-CoV-2 RNA is generally detectable in upper respiratory specimens during the acute phase of infection. The lowest concentration of SARS-CoV-2 viral copies this assay can detect is 138 copies/mL. A negative result does not preclude SARS-Cov-2 infection and should not be used as the sole basis for treatment or other patient management  decisions. A negative result may occur with  improper specimen collection/handling, submission of specimen other than nasopharyngeal swab, presence of viral mutation(s) within the areas targeted by this assay, and inadequate number of viral copies(<138 copies/mL). A negative result must be combined with clinical observations, patient history, and epidemiological information. The expected result is Negative.  Fact Sheet for Patients:  EntrepreneurPulse.com.au  Fact Sheet for Healthcare Providers:  IncredibleEmployment.be  This test is no t yet approved or cleared by the [place] FDA and  has been authorized for detection and/or diagnosis of SARS-CoV-2 by FDA under an Emergency Use Authorization (EUA). This EUA will remain  in effect (meaning this test can be used) for the duration of the COVID-19 declaration under Section 564(b)(1) of the Act, 21 U.S.C.section 360bbb-3(b)(1), unless the authorization is terminated  or revoked sooner.       Influenza A by PCR NEGATIVE NEGATIVE Final   Influenza B by PCR NEGATIVE NEGATIVE Final    Comment: (NOTE) The Xpert Xpress SARS-CoV-2/FLU/RSV plus assay is intended as an aid in the diagnosis of influenza from Nasopharyngeal swab specimens and should not be used as a sole basis for treatment. Nasal washings and aspirates are unacceptable for Xpert Xpress SARS-CoV-2/FLU/RSV testing.  Fact Sheet for Patients: EntrepreneurPulse.com.au  Fact Sheet for Healthcare Providers: IncredibleEmployment.be  This test is not yet approved or cleared by the [place] FDA and has been authorized for detection and/or diagnosis of SARS-CoV-2 by FDA under an Emergency Use Authorization (EUA). This EUA will remain in effect (meaning this test can be used) for the duration of the COVID-19 declaration under Section 564(b)(1) of the Act, 21 U.S.C. section 360bbb-3(b)(1), unless the  authorization is terminated or revoked.  Performed at [facility], [address] [place], [place] [address]   CULTURE, BLOOD (ROUTINE X 2) w Reflex to ID Panel     Status: None (Preliminary result)   Collection Time: 07/27/21  2:37 AM   Specimen: BLOOD  Result Value Ref Range Status   Specimen Description BLOOD RIGHT ARM  Final   Special Requests   Final    BOTTLES DRAWN AEROBIC AND ANAEROBIC Blood Culture adequate volume   Culture   Final    NO GROWTH 2 DAYS Performed at [facility], [address], [place], [place] [address]    Report Status PENDING  Incomplete  CULTURE, BLOOD (ROUTINE X 2) w Reflex to ID Panel     Status: None (Preliminary result)   Collection Time: 07/27/21  2:39 AM   Specimen: BLOOD  Result Value Ref Range Status   Specimen Description BLOOD RIGHT HAND  Final   Special Requests   Final    BOTTLES DRAWN AEROBIC AND ANAEROBIC Blood Culture adequate volume   Culture   Final    NO GROWTH 2 DAYS Performed at [facility], [address], [place], [place] [address]  Report Status PENDING  Incomplete  MRSA Next Gen by PCR, Nasal     Status: None   Collection Time: 07/27/21  2:45 AM   Specimen: Nasal Swab  Result Value Ref Range Status   MRSA by PCR Next Gen NOT DETECTED NOT DETECTED Final    Comment: (NOTE) The GeneXpert MRSA Assay (FDA approved for NASAL specimens only), is one component of a comprehensive MRSA colonization surveillance program. It is not intended to diagnose MRSA infection nor to guide or monitor treatment for MRSA infections. Test performance is not FDA approved in patients less than 77 years old. Performed at [facility], [address] [place] [address], [place] [address]          Radiology Studies: No results found.      Scheduled Meds:  enoxaparin (LOVENOX) injection  40 mg Subcutaneous Q24H   Continuous Infusions:  0.9 % NaCl with KCl 20 mEq / L 75 mL/hr at 07/29/21 0010    ampicillin-sulbactam (UNASYN) IV 3 g (07/29/21 0451)   levETIRAcetam 500 mg (07/29/21 0533)     LOS: 3 days    Time spent: 25 minutes    Sidney Ace, MD [facility]   If 7PM-7AM, please contact night-coverage  07/29/2021, 9:15 AM

## 2021-07-29 NOTE — TOC Progression Note (Signed)
Transition of Care [facility]) - Progression Note    Patient Details  Name: Robin Archer MRN: [id number] Date of Birth: 30-Aug-1946  [facility] CM/SW Contact  Caryn Section, RN Phone Number: 07/29/2021, 11:56 AM  Clinical Narrative:   As per Care team, patient's family would like 24-48 hours to review patient's condition and decide on disposition TOC will follow this weekend.    Expected Discharge Plan: Skilled Nursing Facility Barriers to Discharge: Continued Medical Work up  Expected Discharge Plan and Services Expected Discharge Plan: Skilled Nursing Facility       Living arrangements for the past 2 months: Skilled Nursing Facility                                       Social Determinants of Health (SDOH) Interventions    Readmission Risk Interventions No flowsheet data found.

## 2021-07-30 DIAGNOSIS — T17908D Unspecified foreign body in respiratory tract, part unspecified causing other injury, subsequent encounter: Secondary | ICD-10-CM | POA: Diagnosis not present

## 2021-07-30 DIAGNOSIS — E43 Unspecified severe protein-calorie malnutrition: Secondary | ICD-10-CM | POA: Diagnosis not present

## 2021-07-30 DIAGNOSIS — G7000 Myasthenia gravis without (acute) exacerbation: Secondary | ICD-10-CM | POA: Diagnosis not present

## 2021-07-30 DIAGNOSIS — R569 Unspecified convulsions: Secondary | ICD-10-CM | POA: Diagnosis not present

## 2021-07-30 LAB — CBC WITH DIFFERENTIAL/PLATELET
Abs Immature Granulocytes: 0.01 10*3/uL (ref 0.00–0.07)
Basophils Absolute: 0 10*3/uL (ref 0.0–0.1)
Basophils Relative: 0 %
Eosinophils Absolute: 0.1 10*3/uL (ref 0.0–0.5)
Eosinophils Relative: 2 %
HCT: 22.4 % — ABNORMAL LOW (ref 36.0–46.0)
Hemoglobin: 7.5 g/dL — ABNORMAL LOW (ref 12.0–15.0)
Immature Granulocytes: 0 %
Lymphocytes Relative: 29 %
Lymphs Abs: 1.3 10*3/uL (ref 0.7–4.0)
MCH: 28.4 pg (ref 26.0–34.0)
MCHC: 33.5 g/dL (ref 30.0–36.0)
MCV: 84.8 fL (ref 80.0–100.0)
Monocytes Absolute: 0.4 10*3/uL (ref 0.1–1.0)
Monocytes Relative: 9 %
Neutro Abs: 2.6 10*3/uL (ref 1.7–7.7)
Neutrophils Relative %: 60 %
Platelets: 141 10*3/uL — ABNORMAL LOW (ref 150–400)
RBC: 2.64 MIL/uL — ABNORMAL LOW (ref 3.87–5.11)
RDW: 13.5 % (ref 11.5–15.5)
WBC: 4.4 10*3/uL (ref 4.0–10.5)
nRBC: 0 % (ref 0.0–0.2)

## 2021-07-30 LAB — BASIC METABOLIC PANEL WITH GFR
Anion gap: 1 — ABNORMAL LOW (ref 5–15)
BUN: <5 mg/dL — ABNORMAL LOW (ref 8–23)
CO2: 21 mmol/L — ABNORMAL LOW (ref 22–32)
Calcium: 6.4 mg/dL — CL (ref 8.9–10.3)
Chloride: 119 mmol/L — ABNORMAL HIGH (ref 98–111)
Creatinine, Ser: 0.53 mg/dL (ref 0.44–1.00)
GFR, Estimated: >60 mL/min
Glucose, Bld: 120 mg/dL — ABNORMAL HIGH (ref 70–99)
Potassium: 5.4 mmol/L — ABNORMAL HIGH (ref 3.5–5.1)
Sodium: 141 mmol/L (ref 135–145)

## 2021-07-30 MED ORDER — METOPROLOL TARTRATE 50 MG PO TABS
50.0000 mg | ORAL_TABLET | Freq: Two times a day (BID) | ORAL | Status: DC
  Administered 2021-07-30 – 2021-07-31 (×2): 50 mg via ORAL
  Filled 2021-07-30 (×5): qty 1

## 2021-07-30 MED ORDER — CALCIUM GLUCONATE-NACL 1-0.675 GM/50ML-% IV SOLN
1.0000 g | Freq: Once | INTRAVENOUS | Status: AC
  Administered 2021-07-30: 12:00:00 1000 mg via INTRAVENOUS
  Filled 2021-07-30: qty 50

## 2021-07-30 MED ORDER — SODIUM CHLORIDE 0.9 % IV SOLN: INTRAVENOUS | Status: DC

## 2021-07-30 MED ORDER — LACTATED RINGERS IV SOLN: INTRAVENOUS | Status: DC

## 2021-07-30 NOTE — Progress Notes (Signed)
PROGRESS NOTE    Robin Archer  RWE:[id number] DOB: [birth year] DOA: 07/25/2021 PCP: Alvester Morin, MD    Brief Narrative:  75 y.o. female with past medical history significant for Alzheimer's dementia, myasthenia gravis, spinal stenosis, hypertension, type 2 diabetes, GERD, who presented to [facility] ED with complaints of facial twitching with concerns of seizure.  Patient at baseline is bedbound and is nonverbal.  Has spontaneous eye opening and is noncommunicative for the most part.  Per report patient has not been at her baseline.  Per her daughter at bedside she has been very somnolent recently.  Upon arrival to the ED, patient was tachycardic, hypotensive, with rhythmic twitching of her face and contraction of the neck, reported by EDP with concern for new onset seizure activity.  Work-up in the ED showed negative head CT and negative UA, no evidence of pulmonary embolism on CT angio chest, suspected early aspiration on CT scan.  EEG ordered and pending.  She was started on IV Keppra and empiric antibiotics for aspiration pneumonia.  Admitted by hospitalist service.   07/26/2021: Patient was seen and examined at her bedside in the ED, she is minimally responsive.  Had an episode of emesis while in the ED. 11/16: No appreciable status changes.  Started on Unasyn and azithromycin for presumed aspiration pneumonia and resultant sepsis.  Discussed care plan with neurology.  Patient with poor quality of life and declining over the past several months. 11/17: More awake today.  Remains verbally unresponsive.  No further seizure activity noted.  Palliative care consulted, recommendations appreciated.  Discussion with daughter at bedside today regarding overall patient's status and pending plan of care evaluation. 11/18: No acute status changes over interval.  No fevers noted.  Palliative care met with patient's family yesterday 11/19: Palliative follow-up appreciated.  Currently plan is to  return patient to a long-term care facility with outpatient palliative care follow-up   Assessment & Plan:   Principal Problem:   Altered mental state Active Problems:   HTN (hypertension)   Myasthenia gravis (HCC)   Type II diabetes mellitus ([place])   GERD (gastroesophageal reflux disease)   Seizures (HCC)   Pneumonia   AMS (altered mental status)   Protein-calorie malnutrition, severe  Aspiration pneumonia Acute metabolic encephalopathy secondary to above Severe sepsis secondary to aspiration Sepsis criteria met with tachycardia and fever.  Severe sepsis criteria met with elevated lactic acid Presumed source aspiration Sepsis physiology improved Plan: Continue Unasyn, limit course to 7 days Continue IV fluids for today, lactated Ringer's 75 cc/h Discontinue IV fluids after 8 hours today Follow cultures, no growth to date Monitor vitals and fever curve  Twitching activity in ED, possible new onset seizure Neurology consulted EEG negative Plan: Continue Keppra for now No indication for lumbar tap  Elevated troponin suspect supply demand ischemia No evidence of ACS   Hypokalemia Monitor and replace as necessary   Hypomagnesemia Monitor and replace as necessary   Essential hypertension Restart metoprolol 50 mg twice daily per home dose   Myasthenia gravis Not on treatment   Physical debility/generalized weakness Adult failure to thrive Severe protein calorie malnutrition BMI 15 Severe muscle mass loss SLP evaluation, recommend n.p.o. for now Plan: Palliative follow-up, outpatient palliative care follow-up RD follow-up SLP follow-up   DVT prophylaxis: SQ Lovenox Code Status: DNR Family Communication: Daughter [name] [phone] on 11/16, at bedside 11/17, phone call on 11/18 Disposition Plan: Status is: Inpatient  Remains inpatient appropriate because: Severe sepsis secondary to aspiration pneumonia.  Sepsis  physiology improved.  Appreciate wound care  follow-up.  Will monitor in house for next 24 to 48 hours and plan on discharge back to long-term care facility with outpatient palliative care follow-up    Level of care: Telemetry Medical  Consultants:  Palliative care  Procedures:  None  Antimicrobials: Unasyn Azithromycin   Subjective: Seen and examined.  Verbally unresponsive.  Pleasant.  Visibly no distress  Objective: Vitals:   07/30/21 0052 07/30/21 0545 07/30/21 0546 07/30/21 0815  BP: 123/65 140/63 140/63 (!) 147/62  Pulse:  86 84 88  Resp:  16  18  Temp:  98.9 F (37.2 C)  98.4 F (36.9 C)  TempSrc:      SpO2:  100% 99% 100%  Weight:        Intake/Output Summary (Last 24 hours) at 07/30/2021 1011 Last data filed at 07/29/2021 2332 Gross per 24 hour  Intake --  Output 1050 ml  Net -1050 ml   Filed Weights   07/25/21 1831  Weight: 46 kg    Examination:  General exam: No acute distress.  Frail Respiratory system: Lungs clear.  Normal work of breathing.  Room air Cardiovascular system: S1-S2, regular rate and rhythm, no murmurs, no pedal edema Gastrointestinal system: Thin/scaphoid, nontender, nondistended, decreased bowel sounds Central nervous system: Really unresponsive.  No focal deficits Extremities: Diffuse muscle wasting bilaterally.  Decreased power Skin: No rashes, lesions or ulcers Psychiatry: Unable to assess    Data Reviewed: I have personally reviewed following labs and imaging studies  CBC: Recent Labs  Lab 07/25/21 1851 07/27/21 0520 07/30/21 0741  WBC 6.6 9.2 4.4  NEUTROABS 5.2 7.3 2.6  HGB 11.2* 11.4* 7.5*  HCT 34.2* 34.1* 22.4*  MCV 84.0 83.6 84.8  PLT 192 237 440*   Basic Metabolic Panel: Recent Labs  Lab 07/25/21 1826 07/27/21 0520 07/30/21 0741  NA 143 139 141  K 3.2* 3.6 5.4*  CL 105 105 119*  CO2 25 24 21*  GLUCOSE 201* 193* 120*  BUN 19 13 <5*  CREATININE 0.71 0.75 0.53  CALCIUM 9.7 8.2* 6.4*  MG 1.6* 1.7  --   PHOS  --  2.5  --     GFR: Estimated Creatinine Clearance: 44.8 mL/min (by C-G formula based on SCr of 0.53 mg/dL). Liver Function Tests: Recent Labs  Lab 07/25/21 1826 07/27/21 0520  AST 20 23  ALT 11 10  ALKPHOS 64 52  BILITOT 0.6 0.9  PROT 7.4 6.6  ALBUMIN 4.1 3.7   No results for input(s): LIPASE, AMYLASE in the last 168 hours. No results for input(s): AMMONIA in the last 168 hours. Coagulation Profile: No results for input(s): INR, PROTIME in the last 168 hours. Cardiac Enzymes: Recent Labs  Lab 07/25/21 2047  CKTOTAL 23*   BNP (last 3 results) No results for input(s): PROBNP in the last 8760 hours. HbA1C: No results for input(s): HGBA1C in the last 72 hours. CBG: No results for input(s): GLUCAP in the last 168 hours. Lipid Profile: No results for input(s): CHOL, HDL, LDLCALC, TRIG, CHOLHDL, LDLDIRECT in the last 72 hours. Thyroid Function Tests: No results for input(s): TSH, T4TOTAL, FREET4, T3FREE, THYROIDAB in the last 72 hours.  Anemia Panel: No results for input(s): VITAMINB12, FOLATE, FERRITIN, TIBC, IRON, RETICCTPCT in the last 72 hours. Sepsis Labs: Recent Labs  Lab 07/27/21 0238 07/27/21 0520 07/27/21 0739 07/27/21 1028 07/28/21 0348 07/29/21 0620  PROCALCITON <0.10  --   --   --  <0.10 <0.10  LATICACIDVEN 2.2* 2.9* 2.6*  2.0*  --   --     Recent Results (from the past 240 hour(s))  Resp Panel by RT-PCR (Flu A&B, Covid) Nasopharyngeal Swab     Status: None   Collection Time: 07/26/21  1:15 AM   Specimen: Nasopharyngeal Swab; Nasopharyngeal(NP) swabs in vial transport medium  Result Value Ref Range Status   SARS Coronavirus 2 by RT PCR NEGATIVE NEGATIVE Final    Comment: (NOTE) SARS-CoV-2 target nucleic acids are NOT DETECTED.  The SARS-CoV-2 RNA is generally detectable in upper respiratory specimens during the acute phase of infection. The lowest concentration of SARS-CoV-2 viral copies this assay can detect is 138 copies/mL. A negative result does not  preclude SARS-Cov-2 infection and should not be used as the sole basis for treatment or other patient management decisions. A negative result may occur with  improper specimen collection/handling, submission of specimen other than nasopharyngeal swab, presence of viral mutation(s) within the areas targeted by this assay, and inadequate number of viral copies(<138 copies/mL). A negative result must be combined with clinical observations, patient history, and epidemiological information. The expected result is Negative.  Fact Sheet for Patients:  EntrepreneurPulse.com.au  Fact Sheet for Healthcare Providers:  IncredibleEmployment.be  This test is no t yet approved or cleared by the [place] FDA and  has been authorized for detection and/or diagnosis of SARS-CoV-2 by FDA under an Emergency Use Authorization (EUA). This EUA will remain  in effect (meaning this test can be used) for the duration of the COVID-19 declaration under Section 564(b)(1) of the Act, 21 U.S.C.section 360bbb-3(b)(1), unless the authorization is terminated  or revoked sooner.       Influenza A by PCR NEGATIVE NEGATIVE Final   Influenza B by PCR NEGATIVE NEGATIVE Final    Comment: (NOTE) The Xpert Xpress SARS-CoV-2/FLU/RSV plus assay is intended as an aid in the diagnosis of influenza from Nasopharyngeal swab specimens and should not be used as a sole basis for treatment. Nasal washings and aspirates are unacceptable for Xpert Xpress SARS-CoV-2/FLU/RSV testing.  Fact Sheet for Patients: EntrepreneurPulse.com.au  Fact Sheet for Healthcare Providers: IncredibleEmployment.be  This test is not yet approved or cleared by the [place] FDA and has been authorized for detection and/or diagnosis of SARS-CoV-2 by FDA under an Emergency Use Authorization (EUA). This EUA will remain in effect (meaning this test can be used) for the  duration of the COVID-19 declaration under Section 564(b)(1) of the Act, 21 U.S.C. section 360bbb-3(b)(1), unless the authorization is terminated or revoked.  Performed at [facility], [address] [place], [place] [address]   CULTURE, BLOOD (ROUTINE X 2) w Reflex to ID Panel     Status: None (Preliminary result)   Collection Time: 07/27/21  2:37 AM   Specimen: BLOOD  Result Value Ref Range Status   Specimen Description BLOOD RIGHT ARM  Final   Special Requests   Final    BOTTLES DRAWN AEROBIC AND ANAEROBIC Blood Culture adequate volume   Culture   Final    NO GROWTH 3 DAYS Performed at [facility], [address], [place], [place] [address]    Report Status PENDING  Incomplete  CULTURE, BLOOD (ROUTINE X 2) w Reflex to ID Panel     Status: None (Preliminary result)   Collection Time: 07/27/21  2:39 AM   Specimen: BLOOD  Result Value Ref Range Status   Specimen Description BLOOD RIGHT HAND  Final   Special Requests   Final    BOTTLES DRAWN AEROBIC  AND ANAEROBIC Blood Culture adequate volume   Culture   Final    NO GROWTH 3 DAYS Performed at [facility], [address] [place], [place] [address]    Report Status PENDING  Incomplete  MRSA Next Gen by PCR, Nasal     Status: None   Collection Time: 07/27/21  2:45 AM   Specimen: Nasal Swab  Result Value Ref Range Status   MRSA by PCR Next Gen NOT DETECTED NOT DETECTED Final    Comment: (NOTE) The GeneXpert MRSA Assay (FDA approved for NASAL specimens only), is one component of a comprehensive MRSA colonization surveillance program. It is not intended to diagnose MRSA infection nor to guide or monitor treatment for MRSA infections. Test performance is not FDA approved in patients less than 66 years old. Performed at [facility], [address] [place], [place] [address]          Radiology Studies: No results found.      Scheduled Meds:  enoxaparin (LOVENOX)  injection  40 mg Subcutaneous Q24H   feeding supplement  237 mL Oral BID BM   Continuous Infusions:  ampicillin-sulbactam (UNASYN) IV 3 g (07/30/21 0359)   calcium gluconate     lactated ringers     levETIRAcetam 500 mg (07/30/21 0548)     LOS: 4 days    Time spent: 25 minutes    Sidney Ace, MD [facility]   If 7PM-7AM, please contact night-coverage  07/30/2021, 10:11 AM

## 2021-07-31 DIAGNOSIS — G7000 Myasthenia gravis without (acute) exacerbation: Secondary | ICD-10-CM | POA: Diagnosis not present

## 2021-07-31 DIAGNOSIS — E43 Unspecified severe protein-calorie malnutrition: Secondary | ICD-10-CM | POA: Diagnosis not present

## 2021-07-31 DIAGNOSIS — T17908D Unspecified foreign body in respiratory tract, part unspecified causing other injury, subsequent encounter: Secondary | ICD-10-CM | POA: Diagnosis not present

## 2021-07-31 DIAGNOSIS — R569 Unspecified convulsions: Secondary | ICD-10-CM | POA: Diagnosis not present

## 2021-07-31 LAB — BASIC METABOLIC PANEL
Anion gap: 8 (ref 5–15)
BUN: 6 mg/dL — ABNORMAL LOW (ref 8–23)
CO2: 24 mmol/L (ref 22–32)
Calcium: 7.9 mg/dL — ABNORMAL LOW (ref 8.9–10.3)
Chloride: 105 mmol/L (ref 98–111)
Creatinine, Ser: 0.56 mg/dL (ref 0.44–1.00)
GFR, Estimated: >60 mL/min (ref >60)
Glucose, Bld: 151 mg/dL — ABNORMAL HIGH (ref 70–99)
Potassium: 2.5 mmol/L — CL (ref 3.5–5.1)
Sodium: 137 mmol/L (ref 135–145)

## 2021-07-31 LAB — CBC WITH DIFFERENTIAL/PLATELET
Abs Immature Granulocytes: 0.02 10*3/uL (ref 0.00–0.07)
Basophils Absolute: 0 10*3/uL (ref 0.0–0.1)
Basophils Relative: 1 %
Eosinophils Absolute: 0.1 10*3/uL (ref 0.0–0.5)
Eosinophils Relative: 3 %
HCT: 27.4 % — ABNORMAL LOW (ref 36.0–46.0)
Hemoglobin: 9.3 g/dL — ABNORMAL LOW (ref 12.0–15.0)
Immature Granulocytes: 0 %
Lymphocytes Relative: 24 %
Lymphs Abs: 1.2 10*3/uL (ref 0.7–4.0)
MCH: 27.8 pg (ref 26.0–34.0)
MCHC: 33.9 g/dL (ref 30.0–36.0)
MCV: 82 fL (ref 80.0–100.0)
Monocytes Absolute: 0.3 10*3/uL (ref 0.1–1.0)
Monocytes Relative: 7 %
Neutro Abs: 3.1 10*3/uL (ref 1.7–7.7)
Neutrophils Relative %: 65 %
Platelets: 187 10*3/uL (ref 150–400)
RBC: 3.34 MIL/uL — ABNORMAL LOW (ref 3.87–5.11)
RDW: 13.4 % (ref 11.5–15.5)
WBC: 4.8 10*3/uL (ref 4.0–10.5)
nRBC: 0 % (ref 0.0–0.2)

## 2021-07-31 MED ORDER — POTASSIUM CHLORIDE 10 MEQ/100ML IV SOLN
10.0000 meq | INTRAVENOUS | Status: AC
  Administered 2021-07-31 (×4): 10 meq via INTRAVENOUS
  Filled 2021-07-31 (×4): qty 100

## 2021-07-31 MED ORDER — METOPROLOL TARTRATE 5 MG/5ML IV SOLN
2.5000 mg | Freq: Two times a day (BID) | INTRAVENOUS | Status: DC
Start: 2021-08-01 — End: 2021-08-01
  Administered 2021-08-01: 2.5 mg via INTRAVENOUS
  Filled 2021-07-31: qty 5

## 2021-07-31 MED ORDER — POTASSIUM CHLORIDE CRYS ER 20 MEQ PO TBCR
40.0000 meq | EXTENDED_RELEASE_TABLET | Freq: Once | ORAL | Status: DC
  Filled 2021-07-31: qty 2

## 2021-07-31 MED ORDER — POTASSIUM CHLORIDE 20 MEQ PO PACK: 40.0000 meq | PACK | Freq: Once | ORAL | Status: DC

## 2021-07-31 NOTE — Progress Notes (Signed)
CSW spoke with [facility] who stated there is no one working today that can tell CSW what is needed for patient to return. CSW was told to call back on Monday.

## 2021-07-31 NOTE — Progress Notes (Signed)
PROGRESS NOTE    Robin Archer  ZOX:[id number] DOB: April 21, 1946 DOA: 07/25/2021 PCP: Alvester Morin, MD    Brief Narrative:  75 y.o. female with past medical history significant for Alzheimer's dementia, myasthenia gravis, spinal stenosis, hypertension, type 2 diabetes, GERD, who presented to [facility] ED with complaints of facial twitching with concerns of seizure.  Patient at baseline is bedbound and is nonverbal.  Has spontaneous eye opening and is noncommunicative for the most part.  Per report patient has not been at her baseline.  Per her daughter at bedside she has been very somnolent recently.  Upon arrival to the ED, patient was tachycardic, hypotensive, with rhythmic twitching of her face and contraction of the neck, reported by EDP with concern for new onset seizure activity.  Work-up in the ED showed negative head CT and negative UA, no evidence of pulmonary embolism on CT angio chest, suspected early aspiration on CT scan.  EEG ordered and pending.  She was started on IV Keppra and empiric antibiotics for aspiration pneumonia.  Admitted by hospitalist service.   07/26/2021: Patient was seen and examined at her bedside in the ED, she is minimally responsive.  Had an episode of emesis while in the ED. 11/16: No appreciable status changes.  Started on Unasyn and azithromycin for presumed aspiration pneumonia and resultant sepsis.  Discussed care plan with neurology.  Patient with poor quality of life and declining over the past several months. 11/17: More awake today.  Remains verbally unresponsive.  No further seizure activity noted.  Palliative care consulted, recommendations appreciated.  Discussion with daughter at bedside today regarding overall patient's status and pending plan of care evaluation. 11/18: No acute status changes over interval.  No fevers noted.  Palliative care met with patient's family yesterday 11/19: Palliative follow-up appreciated.  Currently plan is to  return patient to a long-term care facility with outpatient palliative care follow-up   Assessment & Plan:   Principal Problem:   Altered mental state Active Problems:   HTN (hypertension)   Myasthenia gravis (HCC)   Type II diabetes mellitus ([place])   GERD (gastroesophageal reflux disease)   Seizures (HCC)   Pneumonia   AMS (altered mental status)   Protein-calorie malnutrition, severe  Aspiration pneumonia Acute metabolic encephalopathy secondary to above Severe sepsis secondary to aspiration Sepsis criteria met with tachycardia and fever.  Severe sepsis criteria met with elevated lactic acid Presumed source aspiration Sepsis physiology improved Plan: Continue Unasyn, 7-day total course Discontinue IV fluids Follow cultures, no growth to date Monitor vitals and fever curve Recheck electrolytes in a.m.  Twitching activity in ED, possible new onset seizure Neurology consulted EEG negative Low suspicion for seizure Plan: Discontinue Keppra No indication for lumbar tap  Elevated troponin suspect supply demand ischemia No evidence of ACS   Hypokalemia Monitor and replace as necessary   Hypomagnesemia Monitor and replace as necessary   Essential hypertension Restart metoprolol 50 mg twice daily per home dose   Myasthenia gravis Not on treatment   Physical debility/generalized weakness Adult failure to thrive Severe protein calorie malnutrition BMI 15 Severe muscle mass loss SLP evaluation, recommend n.p.o. for now Plan: Palliative follow-up, outpatient palliative care follow-up RD follow-up SLP follow-up   DVT prophylaxis: SQ Lovenox Code Status: DNR Family Communication: Daughter [name] [phone] on 11/16, at bedside 11/17, phone call on 11/18, 11/20 Disposition Plan: Status is: Inpatient  Remains inpatient appropriate because: Severe sepsis secondary to aspiration pneumonia.  Sepsis physiology improved.  Appreciate wound care follow-up.  Discontinue fluids monitor for next 24 hours.  If electrolytes and patient is stable can discharge to skilled nursing as soon as 11/21  Level of care: Telemetry Medical  Consultants:  Palliative care  Procedures:  None  Antimicrobials: Unasyn   Subjective: Seen and examined.  Verbally unresponsive.  Pleasant.  Visibly no distress  Objective: Vitals:   07/30/21 2307 07/31/21 0005 07/31/21 0540 07/31/21 0846  BP: (!) 175/77 (!) 173/63 (!) 163/74 (!) 175/70  Pulse: 89 79 81 89  Resp: _0 Temp: 98.3 F (36.8 C)  98.8 F (37.1 C) 98.4 F (36.9 C)  TempSrc: Oral  Oral Oral  SpO2: 100%  100% 99%  Weight:        Intake/Output Summary (Last 24 hours) at 07/31/2021 0951 Last data filed at 07/31/2021 0300 Gross per 24 hour  Intake 1927.6 ml  Output 950 ml  Net 977.6 ml   Filed Weights   07/25/21 1831  Weight: 46 kg    Examination:  General exam: No acute distress.  Appears frail Respiratory system: Lungs clear.  Normal work of breathing.  Room air Cardiovascular system: S1-S2, regular rate and rhythm, no murmurs, no pedal edema Gastrointestinal system: Thin/scaphoid, nontender, nondistended, decreased bowel sounds Central nervous system: Verbally unresponsive.  No focal deficits Extremities: Diffuse muscle wasting bilaterally.  Decreased power Skin: No rashes, lesions or ulcers Psychiatry: Unable to assess    Data Reviewed: I have personally reviewed following labs and imaging studies  CBC: Recent Labs  Lab 07/25/21 1851 07/27/21 0520 07/30/21 0741 07/31/21 0840  WBC 6.6 9.2 4.4 4.8  NEUTROABS 5.2 7.3 2.6 3.1  HGB 11.2* 11.4* 7.5* 9.3*  HCT 34.2* 34.1* 22.4* 27.4*  MCV 84.0 83.6 84.8 82.0  PLT 192 237 141* 007   Basic Metabolic Panel: Recent Labs  Lab 07/25/21 1826 07/27/21 0520 07/30/21 0741 07/31/21 0840  NA 143 139 141 137  K 3.2* 3.6 5.4* 2.5*  CL 105 105 119* 105  CO2 25 24 21* 24  GLUCOSE 201* 193* 120* 151*  BUN 19 13 <5* 6*   CREATININE 0.71 0.75 0.53 0.56  CALCIUM 9.7 8.2* 6.4* 7.9*  MG 1.6* 1.7  --   --   PHOS  --  2.5  --   --    GFR: Estimated Creatinine Clearance: 44.8 mL/min (by C-G formula based on SCr of 0.56 mg/dL). Liver Function Tests: Recent Labs  Lab 07/25/21 1826 07/27/21 0520  AST 20 23  ALT 11 10  ALKPHOS 64 52  BILITOT 0.6 0.9  PROT 7.4 6.6  ALBUMIN 4.1 3.7   No results for input(s): LIPASE, AMYLASE in the last 168 hours. No results for input(s): AMMONIA in the last 168 hours. Coagulation Profile: No results for input(s): INR, PROTIME in the last 168 hours. Cardiac Enzymes: Recent Labs  Lab 07/25/21 2047  CKTOTAL 23*   BNP (last 3 results) No results for input(s): PROBNP in the last 8760 hours. HbA1C: No results for input(s): HGBA1C in the last 72 hours. CBG: No results for input(s): GLUCAP in the last 168 hours. Lipid Profile: No results for input(s): CHOL, HDL, LDLCALC, TRIG, CHOLHDL, LDLDIRECT in the last 72 hours. Thyroid Function Tests: No results for input(s): TSH, T4TOTAL, FREET4, T3FREE, THYROIDAB in the last 72 hours.  Anemia Panel: No results for input(s): VITAMINB12, FOLATE, FERRITIN, TIBC, IRON, RETICCTPCT in the last 72 hours. Sepsis Labs: Recent Labs  Lab 07/27/21 0238 07/27/21 0520 07/27/21 0739 07/27/21 1028 07/28/21 0348 07/29/21 6226  PROCALCITON <0.10  --   --   --  <0.10 <0.10  LATICACIDVEN 2.2* 2.9* 2.6* 2.0*  --   --     Recent Results (from the past 240 hour(s))  Resp Panel by RT-PCR (Flu A&B, Covid) Nasopharyngeal Swab     Status: None   Collection Time: 07/26/21  1:15 AM   Specimen: Nasopharyngeal Swab; Nasopharyngeal(NP) swabs in vial transport medium  Result Value Ref Range Status   SARS Coronavirus 2 by RT PCR NEGATIVE NEGATIVE Final    Comment: (NOTE) SARS-CoV-2 target nucleic acids are NOT DETECTED.  The SARS-CoV-2 RNA is generally detectable in upper respiratory specimens during the acute phase of infection. The  lowest concentration of SARS-CoV-2 viral copies this assay can detect is 138 copies/mL. A negative result does not preclude SARS-Cov-2 infection and should not be used as the sole basis for treatment or other patient management decisions. A negative result may occur with  improper specimen collection/handling, submission of specimen other than nasopharyngeal swab, presence of viral mutation(s) within the areas targeted by this assay, and inadequate number of viral copies(<138 copies/mL). A negative result must be combined with clinical observations, patient history, and epidemiological information. The expected result is Negative.  Fact Sheet for Patients:  EntrepreneurPulse.com.au  Fact Sheet for Healthcare Providers:  IncredibleEmployment.be  This test is no t yet approved or cleared by the [place] FDA and  has been authorized for detection and/or diagnosis of SARS-CoV-2 by FDA under an Emergency Use Authorization (EUA). This EUA will remain  in effect (meaning this test can be used) for the duration of the COVID-19 declaration under Section 564(b)(1) of the Act, 21 U.S.C.section 360bbb-3(b)(1), unless the authorization is terminated  or revoked sooner.       Influenza A by PCR NEGATIVE NEGATIVE Final   Influenza B by PCR NEGATIVE NEGATIVE Final    Comment: (NOTE) The Xpert Xpress SARS-CoV-2/FLU/RSV plus assay is intended as an aid in the diagnosis of influenza from Nasopharyngeal swab specimens and should not be used as a sole basis for treatment. Nasal washings and aspirates are unacceptable for Xpert Xpress SARS-CoV-2/FLU/RSV testing.  Fact Sheet for Patients: EntrepreneurPulse.com.au  Fact Sheet for Healthcare Providers: IncredibleEmployment.be  This test is not yet approved or cleared by the [place] FDA and has been authorized for detection and/or diagnosis of SARS-CoV-2 by FDA under  an Emergency Use Authorization (EUA). This EUA will remain in effect (meaning this test can be used) for the duration of the COVID-19 declaration under Section 564(b)(1) of the Act, 21 U.S.C. section 360bbb-3(b)(1), unless the authorization is terminated or revoked.  Performed at [facility], [address] [place], [place] [address]   CULTURE, BLOOD (ROUTINE X 2) w Reflex to ID Panel     Status: None (Preliminary result)   Collection Time: 07/27/21  2:37 AM   Specimen: BLOOD  Result Value Ref Range Status   Specimen Description BLOOD RIGHT ARM  Final   Special Requests   Final    BOTTLES DRAWN AEROBIC AND ANAEROBIC Blood Culture adequate volume   Culture   Final    NO GROWTH 4 DAYS Performed at [facility], [address] [place], [place] [address]    Report Status PENDING  Incomplete  CULTURE, BLOOD (ROUTINE X 2) w Reflex to ID Panel     Status: None (Preliminary result)   Collection Time: 07/27/21  2:39 AM   Specimen: BLOOD  Result Value Ref Range Status   Specimen Description  BLOOD RIGHT HAND  Final   Special Requests   Final    BOTTLES DRAWN AEROBIC AND ANAEROBIC Blood Culture adequate volume   Culture   Final    NO GROWTH 4 DAYS Performed at [facility], [address] [place], [place] [address]    Report Status PENDING  Incomplete  MRSA Next Gen by PCR, Nasal     Status: None   Collection Time: 07/27/21  2:45 AM   Specimen: Nasal Swab  Result Value Ref Range Status   MRSA by PCR Next Gen NOT DETECTED NOT DETECTED Final    Comment: (NOTE) The GeneXpert MRSA Assay (FDA approved for NASAL specimens only), is one component of a comprehensive MRSA colonization surveillance program. It is not intended to diagnose MRSA infection nor to guide or monitor treatment for MRSA infections. Test performance is not FDA approved in patients less than 86 years old. Performed at [facility], [address], [place], [place] [address]           Radiology Studies: No results found.      Scheduled Meds:  enoxaparin (LOVENOX) injection  40 mg Subcutaneous Q24H   feeding supplement  237 mL Oral BID BM   metoprolol tartrate  50 mg Oral BID   potassium chloride  40 mEq Oral Once   Continuous Infusions:  ampicillin-sulbactam (UNASYN) IV 3 g (07/31/21 0417)   levETIRAcetam 500 mg (07/31/21 0522)   potassium chloride       LOS: 5 days    Time spent: 25 minutes    Sidney Ace, MD [facility]   If 7PM-7AM, please contact night-coverage  07/31/2021, 9:51 AM

## 2021-07-31 NOTE — Progress Notes (Signed)
CSW spoke with patients daughter, [name] who stated she agrees with the plan of her mother returning to [facility] and starting outpatient palliative care. Patients daughter stated her brother and sister will be at the hospital around 10 AM. Patients daughter stated she was to stop the fluids with her mother and she how she does without it. CSW told patients daughter that she will contact [facility] to see what's needed when patient is ready for discharge.

## 2021-08-01 DIAGNOSIS — E43 Unspecified severe protein-calorie malnutrition: Secondary | ICD-10-CM | POA: Diagnosis not present

## 2021-08-01 DIAGNOSIS — J690 Pneumonitis due to inhalation of food and vomit: Secondary | ICD-10-CM

## 2021-08-01 DIAGNOSIS — R4182 Altered mental status, unspecified: Secondary | ICD-10-CM

## 2021-08-01 DIAGNOSIS — G7000 Myasthenia gravis without (acute) exacerbation: Secondary | ICD-10-CM | POA: Diagnosis not present

## 2021-08-01 DIAGNOSIS — R569 Unspecified convulsions: Secondary | ICD-10-CM | POA: Diagnosis not present

## 2021-08-01 DIAGNOSIS — T17908D Unspecified foreign body in respiratory tract, part unspecified causing other injury, subsequent encounter: Secondary | ICD-10-CM | POA: Diagnosis not present

## 2021-08-01 LAB — CULTURE, BLOOD (ROUTINE X 2)
Culture: NO GROWTH
Culture: NO GROWTH
Special Requests: ADEQUATE
Special Requests: ADEQUATE

## 2021-08-01 LAB — RESP PANEL BY RT-PCR (FLU A&B, COVID) ARPGX2
Influenza A by PCR: NEGATIVE
Influenza B by PCR: NEGATIVE
SARS Coronavirus 2 by RT PCR: NEGATIVE

## 2021-08-01 LAB — POTASSIUM: Potassium: 3.2 mmol/L — ABNORMAL LOW (ref 3.5–5.1)

## 2021-08-01 LAB — MAGNESIUM: Magnesium: 1.4 mg/dL — ABNORMAL LOW (ref 1.7–2.4)

## 2021-08-01 MED ORDER — POTASSIUM CHLORIDE 10 MEQ/100ML IV SOLN
10.0000 meq | INTRAVENOUS | Status: AC
  Administered 2021-08-01 (×4): 10 meq via INTRAVENOUS
  Filled 2021-08-01 (×2): qty 100

## 2021-08-01 MED ORDER — MAGNESIUM SULFATE 2 GM/50ML IV SOLN
2.0000 g | Freq: Once | INTRAVENOUS | Status: AC
  Administered 2021-08-01: 04:00:00 2 g via INTRAVENOUS
  Filled 2021-08-01: qty 50

## 2021-08-01 MED ORDER — HYDRALAZINE HCL 20 MG/ML IJ SOLN
10.0000 mg | INTRAMUSCULAR | Status: DC | PRN
  Administered 2021-08-01: 18:00:00 10 mg via INTRAVENOUS
  Filled 2021-08-01: qty 1

## 2021-08-01 MED ORDER — METOPROLOL TARTRATE 5 MG/5ML IV SOLN
5.0000 mg | Freq: Four times a day (QID) | INTRAVENOUS | Status: DC
  Administered 2021-08-02 (×3): 5 mg via INTRAVENOUS
  Filled 2021-08-01 (×3): qty 5

## 2021-08-01 MED ORDER — MAGNESIUM SULFATE 2 GM/50ML IV SOLN: 2.0000 g | Freq: Once | INTRAVENOUS | Status: DC

## 2021-08-01 MED ORDER — METOPROLOL TARTRATE 5 MG/5ML IV SOLN
5.0000 mg | Freq: Two times a day (BID) | INTRAVENOUS | Status: DC
Start: 2021-08-01 — End: 2021-08-01
  Administered 2021-08-01: 09:00:00 5 mg via INTRAVENOUS
  Filled 2021-08-01: qty 5

## 2021-08-01 MED ORDER — POTASSIUM CHLORIDE 10 MEQ/100ML IV SOLN: 10.0000 meq | INTRAVENOUS | Status: DC

## 2021-08-01 NOTE — Progress Notes (Signed)
Palliative Care Progress Note, Assessment & Plan   Patient Name: Robin Archer       Date: 08/01/2021 DOB: [birth year]  Age: 75 y.o. MRN#: [id number] Attending Physician: Tresa Moore, MD Primary Care Physician: Keane Police, MD Admit Date: 07/25/2021  Reason for Consultation/Follow-up: Establishing goals of care  HPI: 75 y.o. female  with past medical history of Alzheimer's dementia, myasthenia gravis, spinal stenosis, HTN, diabetes type 2, and GERD admitted on 07/25/2021 with concerns for seizures due to facial twitching.  Subsequently found to have early aspiration pneumonia.  Patient placed on Keppra and IV antibiotics.  Neuro was consulted and they do not recommend aggressive care at this time.     At baseline patient is bedbound and nonverbal.  Patient has severe muscle mass loss.  Patient is a longtime resident of a nursing facility that endorses she was eating 25% of her meals prior to admission.  Patient has refused to eat or drink while hospitalized until her son Robin Leriche visited a few days ago.  He was able to get her to eat a few bites of cake and take some sips of Gatorade.  SLP therapist notified and MD gave patient a diet.  Since that time family endorses patient "eats when she wants to".  Plan is set for patient to be discharged back to facility with palliative outpatient services to follow.   Palliative medicine was contacted by daughter Robin Archer who had additional questions.  Summary of counseling/coordination of care: I spoke with Robin Archer over the phone this morning she shared concerns regarding palliative outpatient services.  She needed clarification on how often they would be coming out.  She also was under the impression that palliative outpatient was the same as palliative  medicine team here in the hospital.  I clarified that palliative outpatient services involve the nurse practitioner provider visiting once a month.  I also shared that should she wish to Dr. Johny Shock benefits that the palliative outpatient services team can help coordinate this.  Robin Archer was disappointed that it was daughter-in-law visits.  I shared that every patient's need for palliative is different and that she should contact palliative outpatient services upon discharge to discuss further details.  Robin Archer shared she is not ready to enact her mother's hospice benefits yet.  I shared that hospice can be enacted before she is at end-of-life.  I shared that should she enact her hospice benefits that this would be an additional layer support for the patient and her nursing home.  I described that hospice would involve a nurse visit at least once a week as well as a nurse aide visit once or twice a week.  This would become more frequent should the patient begin to reach end-of-life.  Robin Archer said she appreciated this detail and did not feel she wanted to start hospice at this point.  However I again highlighted that hospice can be initiated at any point and that palliative outpatient will be best to help her facilitate this change should she need to in the future.  Patient continues to just have bites of food and refuses most food and drink offered.  Robin Archer shares her concerned that her mother is not eating and  drinking enough to sustain herself but that she will "see how it goes" at the facility.  Allowed time and space for therapeutic listening and silence. All questions and concerns were addressed.  Code Status: DNR  Prognosis: Unable to determine  Discharge Planning: Skilled Nursing Facility for rehab with Palliative care service follow-up  Recommendations/Plan: Palliative Outpatient services continue discussions regarding EWOL wishes and enacting Hospice benefits  Care plan was discussed with Robin Archer,  patient's daughter   Total Time 15 minutes  Greater than 50%  of this time was spent counseling and coordinating care related to the above assessment and plan.  Thank you for allowing the Palliative Medicine Team to assist in the care of this patient.  Samara Deist L. Manon Hilding, FNP-BC Palliative Medicine Team Team Phone # [phone]

## 2021-08-01 NOTE — Progress Notes (Addendum)
Patient alert, no complaints of pain. Patient refused PO metoprolol, hospitalist aware, IV metoprolol ordered. Patient had a 20 beat run of SVT per CCMD, hospitalist notified, mag and potassium labs ordered and reviewed, Mag and potassium IV ordered per hopitalist, will continue to monitor.

## 2021-08-01 NOTE — Progress Notes (Signed)
   08/01/21 1635  Assess: MEWS Score  Temp 99 F (37.2 C)  BP (!) 203/90  Pulse Rate 77  Resp 17  Level of Consciousness Alert  SpO2 100 %  O2 Device Room Air  Assess: MEWS Score  MEWS Temp 0  MEWS Systolic 2  MEWS Pulse 0  MEWS RR 0  MEWS LOC 0  MEWS Score 2  MEWS Score Color Yellow  Assess: if the MEWS score is Yellow or Red  Were vital signs taken at a resting state? Yes  Focused Assessment Change from prior assessment (see assessment flowsheet)  Does the patient meet 2 or more of the SIRS criteria? Yes  Does the patient have a confirmed or suspected source of infection? Yes  Provider and Rapid Response Notified? Yes  MEWS guidelines implemented *See Row Information* Yes  Treat  Pain Scale PAINAD  Faces Pain Scale 0  Breathing 0  Negative Vocalization 0  Facial Expression 0  Body Language 0  Consolability 0  PAINAD Score 0  Take Vital Signs  Increase Vital Sign Frequency  Yellow: Q 2hr X 2 then Q 4hr X 2, if remains yellow, continue Q 4hrs  Escalate  MEWS: Escalate Yellow: discuss with charge nurse/RN and consider discussing with provider and RRT  Notify: Charge Nurse/RN  Name of Charge Nurse/RN Notified Belva Agee  Date Charge Nurse/RN Notified 08/01/21  Time Charge Nurse/RN Notified 1654  Notify: Provider  Provider Name/Title Dr Georgeann Oppenheim  Date Provider Notified 08/01/21  Time Provider Notified 1655  Provider response See new orders  Date of Provider Response 08/01/21  Time of Provider Response 1655  Document  Patient Outcome Other (Comment) (see prn med given)  Assess: SIRS CRITERIA  SIRS Temperature  0  SIRS Pulse 0  SIRS Respirations  0  SIRS WBC 0  SIRS Score Sum  0

## 2021-08-01 NOTE — TOC Progression Note (Signed)
Transition of Care [facility] [place]) - Progression Note    Patient Details  Name: Robin Archer MRN: [id number] Date of Birth: Oct 16, 1945  Transition of [facility]) CM/SW Contact  Caryn Section, RN Phone Number: 08/01/2021, 9:51 AM  Clinical Narrative:   Anticipated discharge tomorrow to [facility], [place], [place].  Relayed referral to outpatient palliative, [facility] aware.  TOC to follow.    Expected Discharge Plan: Skilled Nursing Facility Barriers to Discharge: Continued Medical Work up  Expected Discharge Plan and Services Expected Discharge Plan: Skilled Nursing Facility       Living arrangements for the past 2 months: Skilled Nursing Facility                                       Social Determinants of Health (SDOH) Interventions    Readmission Risk Interventions No flowsheet data found.

## 2021-08-01 NOTE — Progress Notes (Signed)
PROGRESS NOTE    Robin Archer  WHQ:[id number] DOB: 1945/12/13 DOA: 07/25/2021 PCP: Alvester Morin, MD    Brief Narrative:  75 y.o. female with past medical history significant for Alzheimer's dementia, myasthenia gravis, spinal stenosis, hypertension, type 2 diabetes, GERD, who presented to [facility] ED with complaints of facial twitching with concerns of seizure.  Patient at baseline is bedbound and is nonverbal.  Has spontaneous eye opening and is noncommunicative for the most part.  Per report patient has not been at her baseline.  Per her daughter at bedside she has been very somnolent recently.  Upon arrival to the ED, patient was tachycardic, hypotensive, with rhythmic twitching of her face and contraction of the neck, reported by EDP with concern for new onset seizure activity.  Work-up in the ED showed negative head CT and negative UA, no evidence of pulmonary embolism on CT angio chest, suspected early aspiration on CT scan.  EEG ordered and pending.  She was started on IV Keppra and empiric antibiotics for aspiration pneumonia.  Admitted by hospitalist service.   07/26/2021: Patient was seen and examined at her bedside in the ED, she is minimally responsive.  Had an episode of emesis while in the ED. 11/16: No appreciable status changes.  Started on Unasyn and azithromycin for presumed aspiration pneumonia and resultant sepsis.  Discussed care plan with neurology.  Patient with poor quality of life and declining over the past several months. 11/17: More awake today.  Remains verbally unresponsive.  No further seizure activity noted.  Palliative care consulted, recommendations appreciated.  Discussion with daughter at bedside today regarding overall patient's status and pending plan of care evaluation. 11/18: No acute status changes over interval.  No fevers noted.  Palliative care met with patient's family yesterday 11/19: Palliative follow-up appreciated.  Currently plan is to  return patient to a long-term care facility with outpatient palliative care follow-up   Assessment & Plan:   Principal Problem:   Altered mental state Active Problems:   HTN (hypertension)   Myasthenia gravis (HCC)   Type II diabetes mellitus ([place])   GERD (gastroesophageal reflux disease)   Seizures (HCC)   Pneumonia   AMS (altered mental status)   Protein-calorie malnutrition, severe  Aspiration pneumonia Acute metabolic encephalopathy secondary to above Severe sepsis secondary to aspiration Sepsis criteria met with tachycardia and fever.  Severe sepsis criteria met with elevated lactic acid Presumed source aspiration Sepsis physiology improved Plan: Continue Unasyn, 7-day total course.  Last dose today PM No further IV fluids Follow cultures, no growth to date Monitor vitals and fever curve Recheck electrolytes in a.m. If electrolyte stable and patient fever free tomorrow morning anticipate medical readiness for discharge 11/22  Twitching activity in ED, possible new onset seizure Neurology consulted EEG negative Low suspicion for seizure Keppra stopped Plan: No indication for antiepileptics  Elevated troponin suspect supply demand ischemia No evidence of ACS   Hypokalemia Monitor and replace as necessary   Hypomagnesemia Monitor and replace as necessary   Essential hypertension Restart metoprolol 50 mg twice daily per home dose   Myasthenia gravis Not on treatment   Physical debility/generalized weakness Adult failure to thrive Severe protein calorie malnutrition BMI 15 Severe muscle mass loss SLP evaluation, recommend n.p.o. for now Plan: Referral to outpatient palliative care RD follow-up SLP follow-up   DVT prophylaxis: SQ Lovenox Code Status: DNR Family Communication: Daughter [name] [phone] on 11/16, at bedside 11/17, phone call on 11/18, 11/20, 11/21  Disposition Plan: Status is: Inpatient  Remains inpatient appropriate because:  Severe sepsis secondary to aspiration pneumonia.  Sepsis physiology improved.  Appreciate wound care follow-up.  we will complete course of antibiotics today.  Anticipate medical readiness for discharge 11/22  Level of care: Telemetry Medical  Consultants:  Palliative care  Procedures:  None  Antimicrobials: Unasyn   Subjective: Seen and examined.  Verbally unresponsive.  Pleasant.  Visibly no distress  Objective: Vitals:   07/31/21 2338 08/01/21 0100 08/01/21 0458 08/01/21 0759  BP: 132/70 (!) 160/65 (!) 165/70 (!) 158/62  Pulse: 83 77 80 75  Resp: 20  14 20   Temp: 98 F (36.7 C)  98 F (36.7 C) 98.5 F (36.9 C)  TempSrc: Oral  Oral Axillary  SpO2: 100%  100% 100%  Weight:        Intake/Output Summary (Last 24 hours) at 08/01/2021 1013 Last data filed at 08/01/2021 1000 Gross per 24 hour  Intake 1237.67 ml  Output 1800 ml  Net -562.33 ml   Filed Weights   07/25/21 1831  Weight: 46 kg    Examination:  General exam: No acute distress.  Smiling.  Appears frail Respiratory system: Lungs clear.  Normal work of breathing.  Room air Cardiovascular system: S1-S2, regular rate and rhythm, no murmurs, no pedal edema Gastrointestinal system: Thin/scaphoid, nontender, nondistended, decreased bowel sounds Central nervous system: Baseline verbally unresponsive.  No focal deficits Extremities: Diffuse muscle wasting bilaterally.  Decreased power Skin: No rashes, lesions or ulcers Psychiatry: Unable to assess    Data Reviewed: I have personally reviewed following labs and imaging studies  CBC: Recent Labs  Lab 07/25/21 1851 07/27/21 0520 07/30/21 0741 07/31/21 0840  WBC 6.6 9.2 4.4 4.8  NEUTROABS 5.2 7.3 2.6 3.1  HGB 11.2* 11.4* 7.5* 9.3*  HCT 34.2* 34.1* 22.4* 27.4*  MCV 84.0 83.6 84.8 82.0  PLT 192 237 141* 347   Basic Metabolic Panel: Recent Labs  Lab 07/25/21 1826 07/27/21 0520 07/30/21 0741 07/31/21 0840 08/01/21 0021  NA 143 139 141 137  --   K  3.2* 3.6 5.4* 2.5* 3.2*  CL 105 105 119* 105  --   CO2 25 24 21* 24  --   GLUCOSE 201* 193* 120* 151*  --   BUN 19 13 <5* 6*  --   CREATININE 0.71 0.75 0.53 0.56  --   CALCIUM 9.7 8.2* 6.4* 7.9*  --   MG 1.6* 1.7  --   --  1.4*  PHOS  --  2.5  --   --   --    GFR: Estimated Creatinine Clearance: 44.8 mL/min (by C-G formula based on SCr of 0.56 mg/dL). Liver Function Tests: Recent Labs  Lab 07/25/21 1826 07/27/21 0520  AST 20 23  ALT 11 10  ALKPHOS 64 52  BILITOT 0.6 0.9  PROT 7.4 6.6  ALBUMIN 4.1 3.7   No results for input(s): LIPASE, AMYLASE in the last 168 hours. No results for input(s): AMMONIA in the last 168 hours. Coagulation Profile: No results for input(s): INR, PROTIME in the last 168 hours. Cardiac Enzymes: Recent Labs  Lab 07/25/21 2047  CKTOTAL 23*   BNP (last 3 results) No results for input(s): PROBNP in the last 8760 hours. HbA1C: No results for input(s): HGBA1C in the last 72 hours. CBG: No results for input(s): GLUCAP in the last 168 hours. Lipid Profile: No results for input(s): CHOL, HDL, LDLCALC, TRIG, CHOLHDL, LDLDIRECT in the last 72 hours. Thyroid Function Tests: No results for input(s): TSH, T4TOTAL, FREET4, T3FREE,  THYROIDAB in the last 72 hours.  Anemia Panel: No results for input(s): VITAMINB12, FOLATE, FERRITIN, TIBC, IRON, RETICCTPCT in the last 72 hours. Sepsis Labs: Recent Labs  Lab 07/27/21 0238 07/27/21 0520 07/27/21 0739 07/27/21 1028 07/28/21 0348 07/29/21 0620  PROCALCITON <0.10  --   --   --  <0.10 <0.10  LATICACIDVEN 2.2* 2.9* 2.6* 2.0*  --   --     Recent Results (from the past 240 hour(s))  Resp Panel by RT-PCR (Flu A&B, Covid) Nasopharyngeal Swab     Status: None   Collection Time: 07/26/21  1:15 AM   Specimen: Nasopharyngeal Swab; Nasopharyngeal(NP) swabs in vial transport medium  Result Value Ref Range Status   SARS Coronavirus 2 by RT PCR NEGATIVE NEGATIVE Final    Comment: (NOTE) SARS-CoV-2 target nucleic  acids are NOT DETECTED.  The SARS-CoV-2 RNA is generally detectable in upper respiratory specimens during the acute phase of infection. The lowest concentration of SARS-CoV-2 viral copies this assay can detect is 138 copies/mL. A negative result does not preclude SARS-Cov-2 infection and should not be used as the sole basis for treatment or other patient management decisions. A negative result may occur with  improper specimen collection/handling, submission of specimen other than nasopharyngeal swab, presence of viral mutation(s) within the areas targeted by this assay, and inadequate number of viral copies(<138 copies/mL). A negative result must be combined with clinical observations, patient history, and epidemiological information. The expected result is Negative.  Fact Sheet for Patients:  EntrepreneurPulse.com.au  Fact Sheet for Healthcare Providers:  IncredibleEmployment.be  This test is no t yet approved or cleared by the [place] FDA and  has been authorized for detection and/or diagnosis of SARS-CoV-2 by FDA under an Emergency Use Authorization (EUA). This EUA will remain  in effect (meaning this test can be used) for the duration of the COVID-19 declaration under Section 564(b)(1) of the Act, 21 U.S.C.section 360bbb-3(b)(1), unless the authorization is terminated  or revoked sooner.       Influenza A by PCR NEGATIVE NEGATIVE Final   Influenza B by PCR NEGATIVE NEGATIVE Final    Comment: (NOTE) The Xpert Xpress SARS-CoV-2/FLU/RSV plus assay is intended as an aid in the diagnosis of influenza from Nasopharyngeal swab specimens and should not be used as a sole basis for treatment. Nasal washings and aspirates are unacceptable for Xpert Xpress SARS-CoV-2/FLU/RSV testing.  Fact Sheet for Patients: EntrepreneurPulse.com.au  Fact Sheet for Healthcare Providers: IncredibleEmployment.be  This  test is not yet approved or cleared by the [place] FDA and has been authorized for detection and/or diagnosis of SARS-CoV-2 by FDA under an Emergency Use Authorization (EUA). This EUA will remain in effect (meaning this test can be used) for the duration of the COVID-19 declaration under Section 564(b)(1) of the Act, 21 U.S.C. section 360bbb-3(b)(1), unless the authorization is terminated or revoked.  Performed at [facility], [address] [place], [place] [address]   CULTURE, BLOOD (ROUTINE X 2) w Reflex to ID Panel     Status: None (Preliminary result)   Collection Time: 07/27/21  2:37 AM   Specimen: BLOOD  Result Value Ref Range Status   Specimen Description BLOOD RIGHT ARM  Final   Special Requests   Final    BOTTLES DRAWN AEROBIC AND ANAEROBIC Blood Culture adequate volume   Culture   Final    NO GROWTH 4 DAYS Performed at [facility], [address], [place], [place] [address]    Report Status PENDING  Incomplete  CULTURE, BLOOD (ROUTINE X 2) w Reflex to ID Panel     Status: None (Preliminary result)   Collection Time: 07/27/21  2:39 AM   Specimen: BLOOD  Result Value Ref Range Status   Specimen Description BLOOD RIGHT HAND  Final   Special Requests   Final    BOTTLES DRAWN AEROBIC AND ANAEROBIC Blood Culture adequate volume   Culture   Final    NO GROWTH 4 DAYS Performed at [facility], [address], [place], [place] [address]    Report Status PENDING  Incomplete  MRSA Next Gen by PCR, Nasal     Status: None   Collection Time: 07/27/21  2:45 AM   Specimen: Nasal Swab  Result Value Ref Range Status   MRSA by PCR Next Gen NOT DETECTED NOT DETECTED Final    Comment: (NOTE) The GeneXpert MRSA Assay (FDA approved for NASAL specimens only), is one component of a comprehensive MRSA colonization surveillance program. It is not intended to diagnose MRSA infection nor to guide or monitor treatment for MRSA infections. Test  performance is not FDA approved in patients less than 25 years old. Performed at [facility], [address] [place], [place] [address]          Radiology Studies: No results found.      Scheduled Meds:  enoxaparin (LOVENOX) injection  40 mg Subcutaneous Q24H   feeding supplement  237 mL Oral BID BM   metoprolol tartrate  5 mg Intravenous Q12H   Continuous Infusions:  ampicillin-sulbactam (UNASYN) IV 3 g (08/01/21 0850)     LOS: 6 days    Time spent: 25 minutes    Sidney Ace, MD [facility]   If 7PM-7AM, please contact night-coverage  08/01/2021, 10:13 AM

## 2021-08-02 DIAGNOSIS — R4182 Altered mental status, unspecified: Secondary | ICD-10-CM | POA: Diagnosis not present

## 2021-08-02 LAB — CBC WITH DIFFERENTIAL/PLATELET
Abs Immature Granulocytes: 0.02 10*3/uL (ref 0.00–0.07)
Basophils Absolute: 0 10*3/uL (ref 0.0–0.1)
Basophils Relative: 0 %
Eosinophils Absolute: 0.1 10*3/uL (ref 0.0–0.5)
Eosinophils Relative: 1 %
HCT: 31.2 % — ABNORMAL LOW (ref 36.0–46.0)
Hemoglobin: 10.6 g/dL — ABNORMAL LOW (ref 12.0–15.0)
Immature Granulocytes: 0 %
Lymphocytes Relative: 14 %
Lymphs Abs: 1 10*3/uL (ref 0.7–4.0)
MCH: 28 pg (ref 26.0–34.0)
MCHC: 34 g/dL (ref 30.0–36.0)
MCV: 82.5 fL (ref 80.0–100.0)
Monocytes Absolute: 0.6 10*3/uL (ref 0.1–1.0)
Monocytes Relative: 9 %
Neutro Abs: 5.5 10*3/uL (ref 1.7–7.7)
Neutrophils Relative %: 76 %
Platelets: 254 10*3/uL (ref 150–400)
RBC: 3.78 MIL/uL — ABNORMAL LOW (ref 3.87–5.11)
RDW: 13.8 % (ref 11.5–15.5)
WBC: 7.3 10*3/uL (ref 4.0–10.5)
nRBC: 0 % (ref 0.0–0.2)

## 2021-08-02 LAB — BASIC METABOLIC PANEL
Anion gap: 7 (ref 5–15)
BUN: 7 mg/dL — ABNORMAL LOW (ref 8–23)
CO2: 23 mmol/L (ref 22–32)
Calcium: 8.3 mg/dL — ABNORMAL LOW (ref 8.9–10.3)
Chloride: 106 mmol/L (ref 98–111)
Creatinine, Ser: 0.65 mg/dL (ref 0.44–1.00)
GFR, Estimated: >60 mL/min (ref >60)
Glucose, Bld: 209 mg/dL — ABNORMAL HIGH (ref 70–99)
Potassium: 3.2 mmol/L — ABNORMAL LOW (ref 3.5–5.1)
Sodium: 136 mmol/L (ref 135–145)

## 2021-08-02 LAB — MAGNESIUM: Magnesium: 1.9 mg/dL (ref 1.7–2.4)

## 2021-08-02 MED ORDER — ENSURE ENLIVE PO LIQD: 237.0000 mL | Freq: Two times a day (BID) | ORAL | 12 refills | Status: DC

## 2021-08-02 MED ORDER — POTASSIUM CHLORIDE 10 MEQ/100ML IV SOLN
10.0000 meq | INTRAVENOUS | Status: AC
  Administered 2021-08-02 (×4): 10 meq via INTRAVENOUS
  Filled 2021-08-02 (×4): qty 100

## 2021-08-02 NOTE — Care Management Important Message (Signed)
Important Message  Patient Details  Name: Robin Archer MRN: [id number] Date of Birth: 1946-06-11   Medicare Important Message Given:  Yes  I reviewed the Important Message from Medicare with the patient's POA, Genevie Ann, daughter [phone]) and she is in agreement with the discharge plan.  I asked if she would like me to send her a copy and she replied, no. I thanked her for her time.   Olegario Messier A Tammala Weider 08/02/2021, 3:15 PM

## 2021-08-02 NOTE — Discharge Summary (Signed)
Physician Discharge Summary  Robin Archer DHR:[id number] DOB: 1946-02-17 DOA: 07/25/2021  PCP: Alvester Morin, MD  Admit date: 07/25/2021 Discharge date: 08/02/2021  Admitted From: Long-term care facility Disposition: [facility]  Recommendations for Outpatient Follow-up:  Follow up with PCP in 1-2 weeks Ambulatory referral for outpatient palliative care services  Home Health: No Equipment/Devices: None  Discharge Condition: Stable CODE STATUS: DNR Diet recommendation: Dysphagia 3  Brief/Interim Summary: 75 y.o. female with past medical history significant for Alzheimer's dementia, myasthenia gravis, spinal stenosis, hypertension, type 2 diabetes, GERD, who presented to [facility] ED with complaints of facial twitching with concerns of seizure.  Patient at baseline is bedbound and is nonverbal.  Has spontaneous eye opening and is noncommunicative for the most part.  Per report patient has not been at her baseline.  Per her daughter at bedside she has been very somnolent recently.  Upon arrival to the ED, patient was tachycardic, hypotensive, with rhythmic twitching of her face and contraction of the neck, reported by EDP with concern for new onset seizure activity.  Work-up in the ED showed negative head CT and negative UA, no evidence of pulmonary embolism on CT angio chest, suspected early aspiration on CT scan.  EEG ordered and pending.  She was started on IV Keppra and empiric antibiotics for aspiration pneumonia.  Admitted by hospitalist service.   07/26/2021: Patient was seen and examined at her bedside in the ED, she is minimally responsive.  Had an episode of emesis while in the ED. 11/16: No appreciable status changes.  Started on Unasyn and azithromycin for presumed aspiration pneumonia and resultant sepsis.  Discussed care plan with neurology.  Patient with poor quality of life and declining over the past several months. 11/17: More awake today.  Remains verbally  unresponsive.  No further seizure activity noted.  Palliative care consulted, recommendations appreciated.  Discussion with daughter at bedside today regarding overall patient's status and pending plan of care evaluation. 11/18: No acute status changes over interval.  No fevers noted.  Palliative care met with patient's family yesterday 11/19: Palliative follow-up appreciated.  Currently plan is to return patient to a long-term care facility with outpatient palliative care follow-up 11/21: Patient hemodynamically stable.  Did complete course of antibiotics for aspiration pneumonia.  No antibiotics indicated at discharge.  After repeated discussion with patient family and palliative care decision made to return to long-term care facility with outpatient palliative care services.    Ambulatory referral for outpatient palliative care services      Discharge Diagnoses:  Principal Problem:   Altered mental state Active Problems:   HTN (hypertension)   Myasthenia gravis (HCC)   Type II diabetes mellitus ([place])   GERD (gastroesophageal reflux disease)   Seizures (HCC)   Pneumonia   AMS (altered mental status)   Protein-calorie malnutrition, severe  Aspiration pneumonia Acute metabolic encephalopathy secondary to above Severe sepsis secondary to aspiration Sepsis criteria met with tachycardia and fever.  Severe sepsis criteria met with elevated lactic acid Presumed source aspiration Sepsis physiology improved Plan: Completed 7-day course of Unasyn in house for aspiration pneumonia.  No antibiotics on discharge.  No cultures grown at time of discharge.  Vitals and fever curve are stable.  Electrolytes reassuring   Twitching activity in ED, possible new onset seizure Neurology consulted EEG negative Low suspicion for seizure Plan: Discontinue Keppra No indication for lumbar tap No indication for antiepileptics at time of discharge   Elevated troponin suspect supply demand ischemia No  evidence  of ACS   Hypokalemia Monitored and replaced as necessary   Hypomagnesemia Monitored and replaced as necessary   Essential hypertension Restart metoprolol 50 mg twice daily per home dose   Myasthenia gravis Not on treatment   Physical debility/generalized weakness Adult failure to thrive Severe protein calorie malnutrition BMI 15 Severe muscle mass loss SLP evaluation.  Room recommend dysphagia 3 diet.  This is recommended at time of discharge.  Oral intake has been poor.  Recommend outpatient follow-up with palliative care services.  Discharge Instructions  Discharge Instructions     Diet - low sodium heart healthy   Complete by: As directed    Increase activity slowly   Complete by: As directed       Allergies as of 08/02/2021   No Known Allergies      Medication List     STOP taking these medications    hydroxypropyl methylcellulose / hypromellose 2.5 % ophthalmic solution Commonly known as: ISOPTO TEARS / GONIOVISC   insulin regular 100 units/mL injection Commonly known as: NOVOLIN R   pantoprazole 40 MG tablet Commonly known as: PROTONIX   polyethylene glycol 17 g packet Commonly known as: MIRALAX / GLYCOLAX       TAKE these medications    feeding supplement Liqd Take 237 mLs by mouth 2 (two) times daily between meals.   HumaLOG KwikPen 100 UNIT/ML KwikPen Generic drug: insulin lispro Inject into the skin. Sliding scale   insulin detemir 100 UNIT/ML injection Commonly known as: LEVEMIR Inject 8 Units into the skin at bedtime. Hold for BG < 100 What changed: Another medication with the same name was removed. Continue taking this medication, and follow the directions you see here.   metoprolol tartrate 50 MG tablet Commonly known as: LOPRESSOR Take 50 mg by mouth 2 (two) times daily.        No Known Allergies  Consultations: Palliative care   Procedures/Studies: DG Chest 1 View  Result Date: 07/27/2021 CLINICAL DATA:   Shortness of breath and possible aspiration EXAM: PORTABLE CHEST 1 VIEW COMPARISON:  07/25/2021 FINDINGS: Cardiac shadow is stable. The lungs are clear bilaterally. No infiltrate or effusion is noted. No acute bony abnormality is seen. IMPRESSION: No active disease. Electronically Signed   By: Inez Catalina M.D.   On: 07/27/2021 02:26   CT HEAD WO CONTRAST (5MM)  Result Date: 07/25/2021 CLINICAL DATA:  Seizure. EXAM: CT HEAD WITHOUT CONTRAST TECHNIQUE: Contiguous axial images were obtained from the base of the skull through the vertex without intravenous contrast. COMPARISON:  Head CT dated 11/10/2016. FINDINGS: Brain: Moderate age-related atrophy and chronic microvascular ischemic changes. Interval increase in the ex vacuo dilatation of the left lateral ventricle. There is no acute intracranial hemorrhage. No mass effect or midline shift. No extra-axial fluid collection. Vascular: No hyperdense vessel or unexpected calcification. Skull: Normal. Negative for fracture or focal lesion. Sinuses/Orbits: No acute finding. Other: None IMPRESSION: 1. No acute intracranial pathology. 2. Moderate age-related atrophy and chronic microvascular ischemic changes. Electronically Signed   By: Anner Crete M.D.   On: 07/25/2021 19:24   CT Angio Chest Pulmonary Embolism (PE) W or WO Contrast  Result Date: 07/25/2021 CLINICAL DATA:  Concern for pulmonary embolism. EXAM: CT ANGIOGRAPHY CHEST WITH CONTRAST TECHNIQUE: Multidetector CT imaging of the chest was performed using the standard protocol during bolus administration of intravenous contrast. Multiplanar CT image reconstructions and MIPs were obtained to evaluate the vascular anatomy. CONTRAST:  37m OMNIPAQUE IOHEXOL 350 MG/ML SOLN COMPARISON:  Chest radiograph  dated 07/25/2021. chest CT dated 04/15/2015. FINDINGS: Evaluation of this exam is limited due to respiratory motion artifact. Cardiovascular: There is no cardiomegaly. Small pericardial effusion measuring 9 mm  in thickness anterior to the heart. There is coronary vascular calcification. The thoracic aorta is unremarkable. The origins of the great vessels of the aortic arch appear patent. Evaluation of the pulmonary arteries is very limited due to respiratory motion artifact and suboptimal opacification and timing of the contrast. No definite large or central pulmonary artery embolus identified. Mediastinum/Nodes: No hilar or mediastinal adenopathy. The esophagus is grossly unremarkable. No mediastinal fluid collection. Lungs/Pleura: Diffuse hazy and streaky density throughout the left lung may be related to atelectasis. Atypical infection is not excluded. Clinical correlation is recommended. No lobar consolidation, pleural effusion, or pneumothorax. The central airways are patent. Upper Abdomen: Small scattered calcified liver granuloma. Musculoskeletal: Degenerative changes of the spine and osteopenia. No acute osseous pathology. Review of the MIP images confirms the above findings. IMPRESSION: 1. No large central pulmonary artery embolus. 2. Diffuse hazy and streaky density throughout the left lung may be related to atelectasis. Atypical infection is not excluded. 3. Small pericardial effusion. 4. Aortic Atherosclerosis (ICD10-I70.0). Electronically Signed   By: Anner Crete M.D.   On: 07/25/2021 23:20   DG Chest Portable 1 View  Result Date: 07/25/2021 CLINICAL DATA:  Seizure-like activity.  Altered mental status EXAM: PORTABLE CHEST 1 VIEW.  Patient is rotated. COMPARISON:  Chest x-ray 04/15/2015 FINDINGS: The heart and mediastinal contours are unchanged. No focal consolidation. No pulmonary edema. No pleural effusion. No pneumothorax. No acute osseous abnormality. Likely old healed right proximal humeral fracture. IMPRESSION: No active disease. Electronically Signed   By: Iven Finn M.D.   On: 07/25/2021 19:15   EEG adult  Result Date: 07/26/2021 Lora Havens, MD     07/26/2021  2:55 PM  Patient Name: Valda Christenson MRN: [id number] Epilepsy Attending: Lora Havens Referring Physician/Provider: Dr Florina Ou Date: 07/26/2021 Duration: 21.41mns Patient history: 75year old female presented with facial twitching.  EEG to evaluate for seizures. Level of alertness: Awake AEDs during EEG study: LEV Technical aspects: This EEG study was done with scalp electrodes positioned according to the 10-20 International system of electrode placement. Electrical activity was acquired at a sampling rate of 500Hz and reviewed with a high frequency filter of 70Hz and a low frequency filter of 1Hz. EEG data were recorded continuously and digitally stored. Description: No posterior dominant rhythm was seen. EEG showed continuous generalized 3 to 5 Hz theta-delta slowing. Hyperventilation and photic stimulation were not performed.   Of note, EEG was technically difficult due to significant movement artifact. ABNORMALITY - Continuous slow, generalized IMPRESSION: This technically difficult study is suggestive of moderate diffuse encephalopathy, nonspecific etiology. No seizures or epileptiform discharges were seen throughout the recording. Priyanka OBarbra Sarks     Subjective: Seen and examined at the time of discharge.  Stable no distress.  Verbally unresponsive.  At baseline.  Can discharge to skilled nursing facility.  Discharge Exam: Vitals:   08/02/21 0455 08/02/21 0829  BP: 139/68 (!) 174/76  Pulse: (!) 108 (!) 102  Resp: 16 16  Temp: 98.6 F (37 C) 98.9 F (37.2 C)  SpO2: 100% 97%   Vitals:   08/01/21 1745 08/01/21 2105 08/02/21 0455 08/02/21 0829  BP: (!) 197/84 138/60 139/68 (!) 174/76  Pulse: 84 (!) 110 (!) 108 (!) 102  Resp: _0 Temp: 98.7 F (37.1 C) 98.6 F (  37 C) 98.6 F (37 C) 98.9 F (37.2 C)  TempSrc:  Oral Oral   SpO2: 100% 99% 100% 97%  Weight:        General: Awake.  Nonverbal baseline Cardiovascular: RRR, S1/S2 +, no rubs, no gallops Respiratory: Poor  respiratory effort.  Lungs clear.  Normal work of breathing.  Room air Abdominal: Thin/scaphoid, nontender, nondistended, normal bowel sounds Extremities: Diffuse muscle wasting    The results of significant diagnostics from this hospitalization (including imaging, microbiology, ancillary and laboratory) are listed below for reference.     Microbiology: Recent Results (from the past 240 hour(s))  Resp Panel by RT-PCR (Flu A&B, Covid) Nasopharyngeal Swab     Status: None   Collection Time: 07/26/21  1:15 AM   Specimen: Nasopharyngeal Swab; Nasopharyngeal(NP) swabs in vial transport medium  Result Value Ref Range Status   SARS Coronavirus 2 by RT PCR NEGATIVE NEGATIVE Final    Comment: (NOTE) SARS-CoV-2 target nucleic acids are NOT DETECTED.  The SARS-CoV-2 RNA is generally detectable in upper respiratory specimens during the acute phase of infection. The lowest concentration of SARS-CoV-2 viral copies this assay can detect is 138 copies/mL. A negative result does not preclude SARS-Cov-2 infection and should not be used as the sole basis for treatment or other patient management decisions. A negative result may occur with  improper specimen collection/handling, submission of specimen other than nasopharyngeal swab, presence of viral mutation(s) within the areas targeted by this assay, and inadequate number of viral copies(<138 copies/mL). A negative result must be combined with clinical observations, patient history, and epidemiological information. The expected result is Negative.  Fact Sheet for Patients:  EntrepreneurPulse.com.au  Fact Sheet for Healthcare Providers:  IncredibleEmployment.be  This test is no t yet approved or cleared by the [place] FDA and  has been authorized for detection and/or diagnosis of SARS-CoV-2 by FDA under an Emergency Use Authorization (EUA). This EUA will remain  in effect (meaning this test can be used)  for the duration of the COVID-19 declaration under Section 564(b)(1) of the Act, 21 U.S.C.section 360bbb-3(b)(1), unless the authorization is terminated  or revoked sooner.       Influenza A by PCR NEGATIVE NEGATIVE Final   Influenza B by PCR NEGATIVE NEGATIVE Final    Comment: (NOTE) The Xpert Xpress SARS-CoV-2/FLU/RSV plus assay is intended as an aid in the diagnosis of influenza from Nasopharyngeal swab specimens and should not be used as a sole basis for treatment. Nasal washings and aspirates are unacceptable for Xpert Xpress SARS-CoV-2/FLU/RSV testing.  Fact Sheet for Patients: EntrepreneurPulse.com.au  Fact Sheet for Healthcare Providers: IncredibleEmployment.be  This test is not yet approved or cleared by the [place] FDA and has been authorized for detection and/or diagnosis of SARS-CoV-2 by FDA under an Emergency Use Authorization (EUA). This EUA will remain in effect (meaning this test can be used) for the duration of the COVID-19 declaration under Section 564(b)(1) of the Act, 21 U.S.C. section 360bbb-3(b)(1), unless the authorization is terminated or revoked.  Performed at [facility], [address] [place], [place] [address]   CULTURE, BLOOD (ROUTINE X 2) w Reflex to ID Panel     Status: None   Collection Time: 07/27/21  2:37 AM   Specimen: BLOOD  Result Value Ref Range Status   Specimen Description BLOOD RIGHT ARM  Final   Special Requests   Final    BOTTLES DRAWN AEROBIC AND ANAEROBIC Blood Culture adequate volume   Culture   Final  NO GROWTH 5 DAYS Performed at [facility], [address] [place], [place] [address]    Report Status 08/01/2021 FINAL  Final  CULTURE, BLOOD (ROUTINE X 2) w Reflex to ID Panel     Status: None   Collection Time: 07/27/21  2:39 AM   Specimen: BLOOD  Result Value Ref Range Status   Specimen Description BLOOD RIGHT HAND  Final   Special Requests   Final     BOTTLES DRAWN AEROBIC AND ANAEROBIC Blood Culture adequate volume   Culture   Final    NO GROWTH 5 DAYS Performed at [facility], [address], [place], [place] [address]    Report Status 08/01/2021 FINAL  Final  MRSA Next Gen by PCR, Nasal     Status: None   Collection Time: 07/27/21  2:45 AM   Specimen: Nasal Swab  Result Value Ref Range Status   MRSA by PCR Next Gen NOT DETECTED NOT DETECTED Final    Comment: (NOTE) The GeneXpert MRSA Assay (FDA approved for NASAL specimens only), is one component of a comprehensive MRSA colonization surveillance program. It is not intended to diagnose MRSA infection nor to guide or monitor treatment for MRSA infections. Test performance is not FDA approved in patients less than 68 years old. Performed at [facility], [address] [place], [place] [address]   Resp Panel by RT-PCR (Flu A&B, Covid) Nasopharyngeal Swab     Status: None   Collection Time: 08/01/21 12:31 PM   Specimen: Nasopharyngeal Swab; Nasopharyngeal(NP) swabs in vial transport medium  Result Value Ref Range Status   SARS Coronavirus 2 by RT PCR NEGATIVE NEGATIVE Final    Comment: (NOTE) SARS-CoV-2 target nucleic acids are NOT DETECTED.  The SARS-CoV-2 RNA is generally detectable in upper respiratory specimens during the acute phase of infection. The lowest concentration of SARS-CoV-2 viral copies this assay can detect is 138 copies/mL. A negative result does not preclude SARS-Cov-2 infection and should not be used as the sole basis for treatment or other patient management decisions. A negative result may occur with  improper specimen collection/handling, submission of specimen other than nasopharyngeal swab, presence of viral mutation(s) within the areas targeted by this assay, and inadequate number of viral copies(<138 copies/mL). A negative result must be combined with clinical observations, patient history, and epidemiological information. The  expected result is Negative.  Fact Sheet for Patients:  EntrepreneurPulse.com.au  Fact Sheet for Healthcare Providers:  IncredibleEmployment.be  This test is no t yet approved or cleared by the [place] FDA and  has been authorized for detection and/or diagnosis of SARS-CoV-2 by FDA under an Emergency Use Authorization (EUA). This EUA will remain  in effect (meaning this test can be used) for the duration of the COVID-19 declaration under Section 564(b)(1) of the Act, 21 U.S.C.section 360bbb-3(b)(1), unless the authorization is terminated  or revoked sooner.       Influenza A by PCR NEGATIVE NEGATIVE Final   Influenza B by PCR NEGATIVE NEGATIVE Final    Comment: (NOTE) The Xpert Xpress SARS-CoV-2/FLU/RSV plus assay is intended as an aid in the diagnosis of influenza from Nasopharyngeal swab specimens and should not be used as a sole basis for treatment. Nasal washings and aspirates are unacceptable for Xpert Xpress SARS-CoV-2/FLU/RSV testing.  Fact Sheet for Patients: EntrepreneurPulse.com.au  Fact Sheet for Healthcare Providers: IncredibleEmployment.be  This test is not yet approved or cleared by the [place] FDA and has been authorized for detection and/or diagnosis of  SARS-CoV-2 by FDA under an Emergency Use Authorization (EUA). This EUA will remain in effect (meaning this test can be used) for the duration of the COVID-19 declaration under Section 564(b)(1) of the Act, 21 U.S.C. section 360bbb-3(b)(1), unless the authorization is terminated or revoked.  Performed at [facility], [address] [place], [place] [address]      Labs: BNP (last 3 results) No results for input(s): BNP in the last 8760 hours. Basic Metabolic Panel: Recent Labs  Lab 07/27/21 0520 07/30/21 0741 07/31/21 0840 08/01/21 0021 08/02/21 0740  NA 139 141 137  --  136  K 3.6 5.4* 2.5* 3.2* 3.2*   CL 105 119* 105  --  106  CO2 24 21* 24  --  23  GLUCOSE 193* 120* 151*  --  209*  BUN 13 <5* 6*  --  7*  CREATININE 0.75 0.53 0.56  --  0.65  CALCIUM 8.2* 6.4* 7.9*  --  8.3*  MG 1.7  --   --  1.4* 1.9  PHOS 2.5  --   --   --   --    Liver Function Tests: Recent Labs  Lab 07/27/21 0520  AST 23  ALT 10  ALKPHOS 52  BILITOT 0.9  PROT 6.6  ALBUMIN 3.7   No results for input(s): LIPASE, AMYLASE in the last 168 hours. No results for input(s): AMMONIA in the last 168 hours. CBC: Recent Labs  Lab 07/27/21 0520 07/30/21 0741 07/31/21 0840 08/02/21 0740  WBC 9.2 4.4 4.8 7.3  NEUTROABS 7.3 2.6 3.1 5.5  HGB 11.4* 7.5* 9.3* 10.6*  HCT 34.1* 22.4* 27.4* 31.2*  MCV 83.6 84.8 82.0 82.5  PLT 237 141* 187 254   Cardiac Enzymes: No results for input(s): CKTOTAL, CKMB, CKMBINDEX, TROPONINI in the last 168 hours. BNP: Invalid input(s): POCBNP CBG: No results for input(s): GLUCAP in the last 168 hours. D-Dimer No results for input(s): DDIMER in the last 72 hours. Hgb A1c No results for input(s): HGBA1C in the last 72 hours. Lipid Profile No results for input(s): CHOL, HDL, LDLCALC, TRIG, CHOLHDL, LDLDIRECT in the last 72 hours. Thyroid function studies No results for input(s): TSH, T4TOTAL, T3FREE, THYROIDAB in the last 72 hours.  Invalid input(s): FREET3 Anemia work up No results for input(s): VITAMINB12, FOLATE, FERRITIN, TIBC, IRON, RETICCTPCT in the last 72 hours. Urinalysis    Component Value Date/Time   COLORURINE YELLOW (A) 07/27/2021 0245   APPEARANCEUR HAZY (A) 07/27/2021 0245   APPEARANCEUR Clear 09/30/2014 2123   LABSPEC 1.027 07/27/2021 0245   LABSPEC 1.012 09/30/2014 2123   PHURINE 6.0 07/27/2021 0245   GLUCOSEU >=500 (A) 07/27/2021 0245   GLUCOSEU Negative 09/30/2014 2123   HGBUR NEGATIVE 07/27/2021 0245   BILIRUBINUR NEGATIVE 07/27/2021 0245   BILIRUBINUR Negative 09/30/2014 2123   KETONESUR 20 (A) 07/27/2021 0245   PROTEINUR 100 (A) 07/27/2021 0245    NITRITE NEGATIVE 07/27/2021 0245   LEUKOCYTESUR NEGATIVE 07/27/2021 0245   LEUKOCYTESUR Negative 09/30/2014 2123   Sepsis Labs Invalid input(s): PROCALCITONIN,  WBC,  LACTICIDVEN Microbiology Recent Results (from the past 240 hour(s))  Resp Panel by RT-PCR (Flu A&B, Covid) Nasopharyngeal Swab     Status: None   Collection Time: 07/26/21  1:15 AM   Specimen: Nasopharyngeal Swab; Nasopharyngeal(NP) swabs in vial transport medium  Result Value Ref Range Status   SARS Coronavirus 2 by RT PCR NEGATIVE NEGATIVE Final    Comment: (NOTE) SARS-CoV-2 target nucleic acids are NOT DETECTED.  The SARS-CoV-2 RNA is  generally detectable in upper respiratory specimens during the acute phase of infection. The lowest concentration of SARS-CoV-2 viral copies this assay can detect is 138 copies/mL. A negative result does not preclude SARS-Cov-2 infection and should not be used as the sole basis for treatment or other patient management decisions. A negative result may occur with  improper specimen collection/handling, submission of specimen other than nasopharyngeal swab, presence of viral mutation(s) within the areas targeted by this assay, and inadequate number of viral copies(<138 copies/mL). A negative result must be combined with clinical observations, patient history, and epidemiological information. The expected result is Negative.  Fact Sheet for Patients:  EntrepreneurPulse.com.au  Fact Sheet for Healthcare Providers:  IncredibleEmployment.be  This test is no t yet approved or cleared by the [place] FDA and  has been authorized for detection and/or diagnosis of SARS-CoV-2 by FDA under an Emergency Use Authorization (EUA). This EUA will remain  in effect (meaning this test can be used) for the duration of the COVID-19 declaration under Section 564(b)(1) of the Act, 21 U.S.C.section 360bbb-3(b)(1), unless the authorization is terminated  or  revoked sooner.       Influenza A by PCR NEGATIVE NEGATIVE Final   Influenza B by PCR NEGATIVE NEGATIVE Final    Comment: (NOTE) The Xpert Xpress SARS-CoV-2/FLU/RSV plus assay is intended as an aid in the diagnosis of influenza from Nasopharyngeal swab specimens and should not be used as a sole basis for treatment. Nasal washings and aspirates are unacceptable for Xpert Xpress SARS-CoV-2/FLU/RSV testing.  Fact Sheet for Patients: EntrepreneurPulse.com.au  Fact Sheet for Healthcare Providers: IncredibleEmployment.be  This test is not yet approved or cleared by the [place] FDA and has been authorized for detection and/or diagnosis of SARS-CoV-2 by FDA under an Emergency Use Authorization (EUA). This EUA will remain in effect (meaning this test can be used) for the duration of the COVID-19 declaration under Section 564(b)(1) of the Act, 21 U.S.C. section 360bbb-3(b)(1), unless the authorization is terminated or revoked.  Performed at [facility], [address] [place], [place] [address]   CULTURE, BLOOD (ROUTINE X 2) w Reflex to ID Panel     Status: None   Collection Time: 07/27/21  2:37 AM   Specimen: BLOOD  Result Value Ref Range Status   Specimen Description BLOOD RIGHT ARM  Final   Special Requests   Final    BOTTLES DRAWN AEROBIC AND ANAEROBIC Blood Culture adequate volume   Culture   Final    NO GROWTH 5 DAYS Performed at [facility], [address] [place], [place] [address]    Report Status 08/01/2021 FINAL  Final  CULTURE, BLOOD (ROUTINE X 2) w Reflex to ID Panel     Status: None   Collection Time: 07/27/21  2:39 AM   Specimen: BLOOD  Result Value Ref Range Status   Specimen Description BLOOD RIGHT HAND  Final   Special Requests   Final    BOTTLES DRAWN AEROBIC AND ANAEROBIC Blood Culture adequate volume   Culture   Final    NO GROWTH 5 DAYS Performed at [facility], [address] [place], [place] [address]    Report Status 08/01/2021 FINAL  Final  MRSA Next Gen by PCR, Nasal     Status: None   Collection Time: 07/27/21  2:45 AM   Specimen: Nasal Swab  Result Value Ref Range Status   MRSA by PCR Next Gen NOT DETECTED NOT DETECTED Final    Comment: (NOTE)  The GeneXpert MRSA Assay (FDA approved for NASAL specimens only), is one component of a comprehensive MRSA colonization surveillance program. It is not intended to diagnose MRSA infection nor to guide or monitor treatment for MRSA infections. Test performance is not FDA approved in patients less than 70 years old. Performed at [facility], [address] [place], [place] [address]   Resp Panel by RT-PCR (Flu A&B, Covid) Nasopharyngeal Swab     Status: None   Collection Time: 08/01/21 12:31 PM   Specimen: Nasopharyngeal Swab; Nasopharyngeal(NP) swabs in vial transport medium  Result Value Ref Range Status   SARS Coronavirus 2 by RT PCR NEGATIVE NEGATIVE Final    Comment: (NOTE) SARS-CoV-2 target nucleic acids are NOT DETECTED.  The SARS-CoV-2 RNA is generally detectable in upper respiratory specimens during the acute phase of infection. The lowest concentration of SARS-CoV-2 viral copies this assay can detect is 138 copies/mL. A negative result does not preclude SARS-Cov-2 infection and should not be used as the sole basis for treatment or other patient management decisions. A negative result may occur with  improper specimen collection/handling, submission of specimen other than nasopharyngeal swab, presence of viral mutation(s) within the areas targeted by this assay, and inadequate number of viral copies(<138 copies/mL). A negative result must be combined with clinical observations, patient history, and epidemiological information. The expected result is Negative.  Fact Sheet for Patients:  EntrepreneurPulse.com.au  Fact Sheet for Healthcare Providers:   IncredibleEmployment.be  This test is no t yet approved or cleared by the [place] FDA and  has been authorized for detection and/or diagnosis of SARS-CoV-2 by FDA under an Emergency Use Authorization (EUA). This EUA will remain  in effect (meaning this test can be used) for the duration of the COVID-19 declaration under Section 564(b)(1) of the Act, 21 U.S.C.section 360bbb-3(b)(1), unless the authorization is terminated  or revoked sooner.       Influenza A by PCR NEGATIVE NEGATIVE Final   Influenza B by PCR NEGATIVE NEGATIVE Final    Comment: (NOTE) The Xpert Xpress SARS-CoV-2/FLU/RSV plus assay is intended as an aid in the diagnosis of influenza from Nasopharyngeal swab specimens and should not be used as a sole basis for treatment. Nasal washings and aspirates are unacceptable for Xpert Xpress SARS-CoV-2/FLU/RSV testing.  Fact Sheet for Patients: EntrepreneurPulse.com.au  Fact Sheet for Healthcare Providers: IncredibleEmployment.be  This test is not yet approved or cleared by the [place] FDA and has been authorized for detection and/or diagnosis of SARS-CoV-2 by FDA under an Emergency Use Authorization (EUA). This EUA will remain in effect (meaning this test can be used) for the duration of the COVID-19 declaration under Section 564(b)(1) of the Act, 21 U.S.C. section 360bbb-3(b)(1), unless the authorization is terminated or revoked.  Performed at [facility], [address], [place], [place] [address]      Time coordinating discharge: Over 30 minutes  SIGNED:   Sidney Ace, MD  [facility] 08/02/2021, 10:12 AM Pager   If 7PM-7AM, please contact night-coverage

## 2021-08-02 NOTE — NC FL2 (Signed)
[place] MEDICAID FL2 LEVEL OF CARE SCREENING TOOL     IDENTIFICATION  Patient Name: Robin Archer Birthdate: 02-28-1946 Sex: female Admission Date (Current Location): 07/25/2021  [facility] and [place] Number:  Chiropodist and Address:  [facility], [address] [place], [place] [address]      Provider Number: [id number]  Attending Physician Name and Address:  Tresa Moore, MD  Relative Name and Phone Number:  Genevie Ann (Daughter)   [phone] [facility])    Current Level of Care: Hospital Recommended Level of Care: Skilled Nursing Facility Prior Approval Number:    Date Approved/Denied:   PASRR Number: [id number]  Discharge Plan: SNF    Current Diagnoses: Patient Active Problem List   Diagnosis Date Noted   Protein-calorie malnutrition, severe 07/28/2021   Altered mental state 07/26/2021   Seizures (HCC) 07/26/2021   Pneumonia 07/26/2021   AMS (altered mental status) 07/26/2021   Mixed Alzheimer's and vascular dementia (HCC) 05/26/2015   Acute encephalopathy 04/15/2015   Dementia (HCC) 04/15/2015   Right humeral fracture 04/15/2015   Fracture of left clavicle 04/15/2015   Tachycardia 04/15/2015   HTN (hypertension) 04/15/2015   Myasthenia gravis (HCC) 04/15/2015   Type II diabetes mellitus (HCC) 04/15/2015   GERD (gastroesophageal reflux disease) 04/15/2015   Onychomycosis due to dermatophyte 02/19/2014    Orientation RESPIRATION BLADDER Height & Weight      (Patient is non-verbal, unable to assess)  Normal (99% on RA) Incontinent Weight: 46 kg Height:     BEHAVIORAL SYMPTOMS/MOOD NEUROLOGICAL BOWEL NUTRITION STATUS      Incontinent Diet (DYS 3 has been refusing PO in Hospital)  AMBULATORY STATUS COMMUNICATION OF NEEDS Skin   Total Care Verbally Bruising (Eccymosis R arm)                       Personal Care Assistance Level of Assistance  Bathing, Feeding, Dressing Bathing Assistance: Maximum  assistance Feeding assistance: Maximum assistance Dressing Assistance: Maximum assistance     Functional Limitations Info             SPECIAL CARE FACTORS FREQUENCY  Blood pressure                    Contractures Contractures Info: Not present    Additional Factors Info  Code Status, Allergies (Patient has been referred to outpatient palliative services.) Code Status Info: DNR Allergies Info: No known allergies           Current Medications (08/02/2021):  This is the current hospital active medication list Current Facility-Administered Medications  Medication Dose Route Frequency Provider Last Rate Last Admin   acetaminophen (TYLENOL) suppository 650 mg  650 mg Rectal Q4H PRN Jimmye Norman, NP   650 mg at 07/27/21 0052   acetaminophen (TYLENOL) tablet 650 mg  650 mg Oral Q6H PRN Jimmye Norman, NP       enoxaparin (LOVENOX) injection 40 mg  40 mg Subcutaneous Q24H Hall, Carole N, DO   40 mg at 08/01/21 1647   feeding supplement (ENSURE ENLIVE / ENSURE PLUS) liquid 237 mL  237 mL Oral BID BM Sreenath, Sudheer B, MD   237 mL at 08/01/21 1300   hydrALAZINE (APRESOLINE) injection 10 mg  10 mg Intravenous Q4H PRN Lolita Patella B, MD   10 mg at 08/01/21 1814   labetalol (NORMODYNE) injection 10 mg  10 mg Intravenous Q2H PRN Jimmye Norman, NP   10 mg at 08/01/21  1643   LORazepam (ATIVAN) injection 2 mg  2 mg Intravenous Q6H PRN Gertha Calkin, MD       metoprolol tartrate (LOPRESSOR) injection 5 mg  5 mg Intravenous Q6H Sreenath, Sudheer B, MD   5 mg at 08/02/21 0538   ondansetron (ZOFRAN) injection 4 mg  4 mg Intravenous Q6H PRN Jimmye Norman, NP       potassium chloride 10 mEq in 100 mL IVPB  10 mEq Intravenous Q1 Hr x 4 Sreenath, Sudheer B, MD 100 mL/hr at 08/02/21 1053 10 mEq at 08/02/21 1053     Discharge Medications: Please see discharge summary for a list of discharge medications.  Relevant Imaging Results:  Relevant Lab  Results:   Additional Information SSN [id number]  Caryn Section, RN

## 2021-08-02 NOTE — TOC Progression Note (Signed)
Transition of Care [facility]) - Progression Note    Patient Details  Name: Robin Archer MRN: [id number] Date of Birth: 30-May-1946  Transition of Care [facility]) CM/SW Contact  Caryn Section, RN Phone Number: 08/02/2021, 11:30 AM  Clinical Narrative:   Patient to be transferred to [facility], long term patient, via EMS today.  She will go to [facility]  Daughter aware.    Expected Discharge Plan: Skilled Nursing Facility Barriers to Discharge: Continued Medical Work up  Expected Discharge Plan and Services Expected Discharge Plan: Skilled Nursing Facility       Living arrangements for the past 2 months: Skilled Nursing Facility Expected Discharge Date: 08/02/21                                     Social Determinants of Health (SDOH) Interventions    Readmission Risk Interventions No flowsheet data found.

## 2021-08-02 NOTE — Care Management Important Message (Signed)
Important Message  Patient Details  Name: Robin Archer MRN: [id number] Date of Birth: August 12, 1946   Medicare Important Message Given:  Other (see comment)  I left a message with the patient's daughter, Ermalene Searing ([phone]) asking her to call me back at her convenience so I could review the Important Message from Medicare.  I will await a return call.   Olegario Messier A Jameis Newsham 08/02/2021, 3:05 PM

## 2021-08-05 ENCOUNTER — Non-Acute Institutional Stay: Payer: Medicare Other | Admitting: Primary Care

## 2021-08-05 ENCOUNTER — Other Ambulatory Visit: Payer: Self-pay

## 2021-08-05 VITALS — Wt 96.0 lb

## 2021-08-05 DIAGNOSIS — F0280 Dementia in other diseases classified elsewhere without behavioral disturbance: Secondary | ICD-10-CM

## 2021-08-05 DIAGNOSIS — E1165 Type 2 diabetes mellitus with hyperglycemia: Secondary | ICD-10-CM

## 2021-08-05 DIAGNOSIS — F0150 Vascular dementia without behavioral disturbance: Secondary | ICD-10-CM

## 2021-08-05 DIAGNOSIS — G7000 Myasthenia gravis without (acute) exacerbation: Secondary | ICD-10-CM

## 2021-08-05 DIAGNOSIS — E43 Unspecified severe protein-calorie malnutrition: Secondary | ICD-10-CM

## 2021-08-05 DIAGNOSIS — Z515 Encounter for palliative care: Secondary | ICD-10-CM

## 2021-08-05 NOTE — Progress Notes (Addendum)
Designer, jewellery Palliative Care Consult Note Telephone: [phone]  Fax: [phone]    Date of encounter: 08/05/21 10 am PATIENT NAME: [address] [place] [address]   [phone] (home)  DOB: [birth year] MRN: [id number] PRIMARY CARE PROVIDER:    Alvester Morin, MD,  [address] [place] [address] [phone]  REFERRING PROVIDER:   Alvester Morin, MD [address] [place],  [place] [address] [phone]  RESPONSIBLE PARTY:    Contact Information     Name Relation Home Work Mobile   [place] Daughter   [phone]   Debbrah Alar Daughter [phone]       I met face to face with patient in [facility]. Palliative Care was asked to follow this patient by consultation request of  Slade-Hartman, Ivette Loyal* to address advance care planning and complex medical decision making. This is a follow up visit.                                   ASSESSMENT AND PLAN / RECOMMENDATIONS:   Advance Care Planning/Goals of Care: Goals include to maximize quality of life and symptom management. Our advance care planning conversation included a discussion about:    The value and importance of advance care planning  Exploration of personal, cultural or spiritual beliefs that might influence medical decisions  Exploration of goals of care in the event of a sudden injury or illness  Identification of a healthcare agent - daughters Review  of an  advance directive document . Decision  to de-escalate disease focused treatments due to poor prognosis.  I spoke with daughter [name] prior to my visit and let her know I would assess for hospice admission.Has had recommendation for palliative/ hospice in hospital and would like these services. I explained the different between the services, and family elects hospice.  CODE STATUS: DNR  I reviewed a MOST form today.  The patient and family outlined their wishes for the following treatment decisions:  Cardiopulmonary Resuscitation: Do Not Attempt Resuscitation (DNR/No CPR)  Medical Interventions: Comfort Measures: Keep clean, warm, and dry. Use medication by any route, positioning, wound care, and other measures to relieve pain and suffering. Use oxygen, suction and manual treatment of airway obstruction as needed for comfort. Do not transfer to the hospital unless comfort needs cannot be met in current location.  Antibiotics: Determine use of limitation of antibiotics when infection occurs  IV Fluids: IV fluids for a defined trial period  Feeding Tube: No feeding tube     I spent 20 minutes providing this consultation. More than 50% of the time in this consultation was spent in counseling and care coordination.  ------------------------------------------------------------------------------------------------------ Symptom Management/Plan:  I met with patient in her nursing room home room. She is alert and able to answer questions by nodding yes and no. This is her baseline. She appears uncomfortable but denies discomfort. Her heart rate is rapid and she's slightly febrile at 99.0 and 105 heart rate.  Family called today to discuss palliative care ,not understanding she has been followed by me for several years.  While in the hospital MD requested palliative following. We discussed her needing more care and that she will qualify for hospice services based on weight loss diagnosis.  Patient has been chronically ill for some years with very little function level  albeit stable. Her intake  and weight decline  however is new.  Seizures/PNA: She went to ED with facial seizure like activity but all scans were negative for head trauma, cva and uti was neg. CT showed possible atypical infection / effusions. She was Rx for pneumonia in the hospital 10 days ago, and d/c without further abx course.  Family has indicated more  supportive care at this juncture. Patient in the past has made it clear she did not want to be prolonged in her declined state of health.   Nutrition: Has lost 10% of weight since July. She now weighs 96 lbs, down from 107. Body mass index is 14.6 kg/m. Albumin was 4.1 at hospital but intake is < 50% consistently.  Follow up Palliative Care Visit: Palliative care will continue to follow for complex medical decision making, advance care planning, and clarification of goals. Return 2 weeks or prn if not admitted to hospice in place.   PPS: 30%  HOSPICE ELIGIBILITY/DIAGNOSIS: yes/ abnormal weight loss  Chief Complaint: abnormal weight loss, debility  HISTORY OF PRESENT ILLNESS:  Robin Archer is a 75 y.o. year old female  with dementia, MG, abnormal weight loss, recent report of seizure like activities . Was in hospital for these assumed seizures, and GOC were discussed with family. She was referred to palliative, although she was already well known to our service. Today she is more ill appearing than her baseline. She is on minimal medications ( DM, BP) and has been losing weight at a significant rate, 10 % in 4 months. Family is interested in supportive care and hospice services if patient is eligible.  History obtained from review of EMR, discussion with primary team, and interview with family, facility staff/caregiver and/or [name].  I reviewed available labs, medications, imaging, studies and related documents from the EMR.  Records reviewed and summarized above.   ROS/staff  General: NAD ENMT: endorses dysphagia Cardiovascular: denies chest pain, denies DOE Pulmonary: denies cough, denies increased SOB Abdomen: endorses fair appetite, denies constipation, endorses incontinence of bowel GU: denies dysuria, endorses incontinence of urine MSK:  endorses  weakness,  no falls reported Skin: denies rashes or wounds Neurological: denies pain, denies insomnia Psych: Endorses  anxious  mood Heme/lymph/immuno: denies bruises, abnormal bleeding  Physical Exam: Current and past weights: 96 lbs, Body mass index is 14.6 kg/m. 10% loss from 7/22 of 107 lbs. Constitutional:HR 105 RR 18 Temp 99.0  General: frail appearing, thin EYES: anicteric sclera, lids intact, no discharge  ENMT: intact hearing, oral mucous membranes moist CV: S1S2, RRR, no LE edema Pulmonary: LCTA, no increased work of breathing, no cough, room air Abdomen: intake < 50%, normo-active BS + 4 quadrants, soft and non tender, no ascites GU: deferred MSK: + sarcopenia, functional quadraplegia, non ambulatory Skin: warm and dry, no rashes or wounds on visible skin Neuro:  ++ generalized weakness, ++ cognitive impairment Psych: slight anxious affect, A and O x 1 Hem/lymph/immuno: no widespread bruising  Patient Active Problem List   Diagnosis Date Noted   Protein-calorie malnutrition, severe 07/28/2021   Altered mental state 07/26/2021   Seizures ([place]) 07/26/2021   Pneumonia 07/26/2021   AMS (altered mental status) 07/26/2021   Mixed Alzheimer's and vascular dementia ([place]) 05/26/2015   Acute encephalopathy 04/15/2015   Dementia ([place]) 04/15/2015   Right humeral fracture 04/15/2015   Fracture of left clavicle 04/15/2015   Tachycardia 04/15/2015   HTN (hypertension) 04/15/2015   Myasthenia gravis ([place]) 04/15/2015   Type II diabetes mellitus ([place]) 04/15/2015   GERD (gastroesophageal reflux  disease) 04/15/2015   Onychomycosis due to dermatophyte 02/19/2014    Outpatient Encounter Medications as of 09-03-2021  Medication Sig   feeding supplement (ENSURE ENLIVE / ENSURE PLUS) LIQD Take 237 mLs by mouth 2 (two) times daily between meals.   HUMALOG KWIKPEN 100 UNIT/ML KwikPen Inject into the skin. Sliding scale   insulin detemir (LEVEMIR) 100 UNIT/ML injection Inject 8 Units into the skin at bedtime. Hold for BG < 100   metoprolol tartrate (LOPRESSOR) 50 MG tablet Take 50 mg by mouth 2 (two) times daily.    No facility-administered encounter medications on file as of 09/03/2021.   This visit was coded based on medical decision making (MDM).  Thank you for the opportunity to participate in the care of [name].  The palliative care team will continue to follow. Please call our office at [phone] if we can be of additional assistance.   Jason Coop, NP DNP, AGPCNP-BC  COVID-19 PATIENT SCREENING TOOL Asked and negative response unless otherwise noted:   Have you had symptoms of covid, tested positive or been in contact with someone with symptoms/positive test in the past 5-10 days?

## 2021-08-10 ENCOUNTER — Non-Acute Institutional Stay: Payer: Medicare Other | Admitting: Primary Care

## 2021-08-10 ENCOUNTER — Other Ambulatory Visit: Payer: Self-pay

## 2021-08-10 DIAGNOSIS — Z515 Encounter for palliative care: Secondary | ICD-10-CM

## 2021-08-10 DIAGNOSIS — G7000 Myasthenia gravis without (acute) exacerbation: Secondary | ICD-10-CM

## 2021-08-10 NOTE — Progress Notes (Signed)
    Therapist, nutritional Palliative Care Consult Note Telephone: [phone]  Fax: [phone]    Due to the COVID-19 crisis, this visit was done via telemedicine from my office and it was initiated and consent by this patient and or family.  I connected with  Rayel Santizo'  PROXY on 08/10/21 by a telemedicine application and verified that I am speaking with the correct person using two identifiers.   I discussed the limitations of evaluation and management by telemedicine. The  proxy expressed understanding and agreed to proceed.  Date of encounter: 08/10/21 PATIENT NAME: Miyeko Mahlum [address] [place] [address]   [phone] (home)  DOB: Jan 29, 1946 MRN: [id number] PRIMARY CARE PROVIDER:    Keane Police, MD,  [phone] [place] [address] [phone]  REFERRING PROVIDER:   Keane Police, MD [phone] [place],  [place] [address] [phone]  RESPONSIBLE PARTY:    Contact Information     Name Relation Home Work Mobile   [place] Daughter   [phone]   Stephania Fragmin Daughter [phone]       Palliative Care was asked to follow this patient by consultation request of  Keane Police* to address advance care planning.                                ASSESSMENT AND PLAN / RECOMMENDATIONS:   Advance Care Planning/Goals of Care: Goals include to maximize quality of life and symptom management. Our advance care planning conversation included a discussion about:    The value and importance of advance care planning  Exploration of personal, cultural or spiritual beliefs that might influence medical decisions  Exploration of goals of care in the event of a sudden injury or illness  Identification of a healthcare agent -ammy and siblings discuss together Review  of an  advance directive document . Decision   to  de-escalate disease focused  treatments due to poor prognosis. Discussed hospice services and patient prognosis, she was on hospice in the past but now has declined by wt loss and dementia to qualify. Family wishes to engage services.  Family chooses hospice services for supportive care, sx management. CODE STATUS: DNR  Follow up Palliative Care Visit: Refer to hospice services  I spent 20 minutes providing this consultation. More than 50% of the time in this consultation was spent in counseling and care coordination.  Thank you for the opportunity to participate in the care of Ms. Gerilyn Pilgrim.  The palliative care team will continue to follow. Please call our office at [phone] if we can be of additional assistance.   Eliezer Lofts, NP , DNP, MPH, AGPCNP-BC, ACHPN  COVID-19 PATIENT SCREENING TOOL Asked and negative response unless otherwise noted:   Have you had symptoms of covid, tested positive or been in contact with someone with symptoms/positive test in the past 5-10 days?

## 2022-09-11 DEATH — deceased

## 2023-06-12 IMAGING — CT CT ANGIO CHEST
2 of 6 series · 18 of 46 positions shown · IV contrast (omnipaque)
Comparison: Chest radiograph dated 07/25/2021. chest CT dated
04/15/2015.

CLINICAL DATA: Concern for pulmonary embolism.

EXAM:
CT ANGIOGRAPHY CHEST WITH CONTRAST
TECHNIQUE: Multidetector CT imaging of the chest was performed using the
standard protocol during bolus administration of intravenous
contrast. Multiplanar CT image reconstructions and MIPs were
obtained to evaluate the vascular anatomy.
CONTRAST:  75mL OMNIPAQUE IOHEXOL 350 MG/ML SOLN

[Series 5: thins · axial · 0.70mm/px · z∈[-659,-390]mm · 15 of 368 slices shown]
[im 16/368  lung]
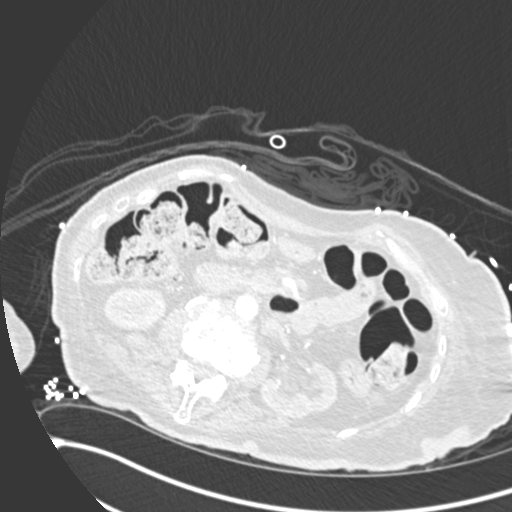
[im 46/368  soft-tissue]
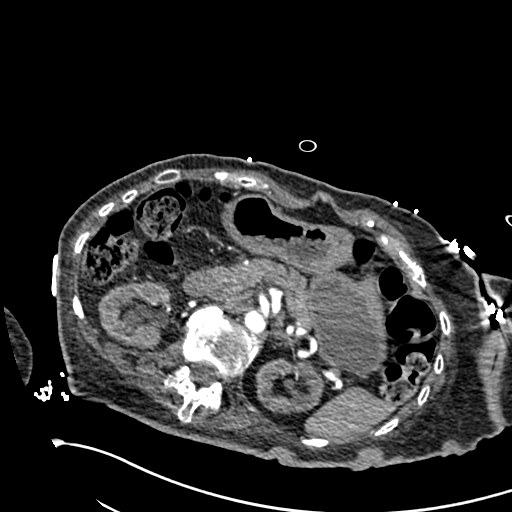
[im 62/368  lung]
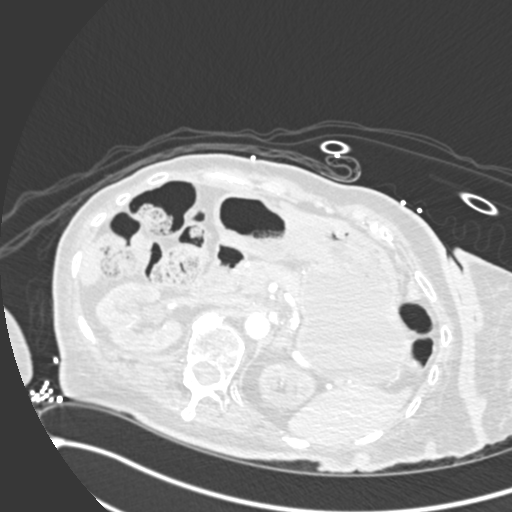
[im 92/368  soft-tissue]
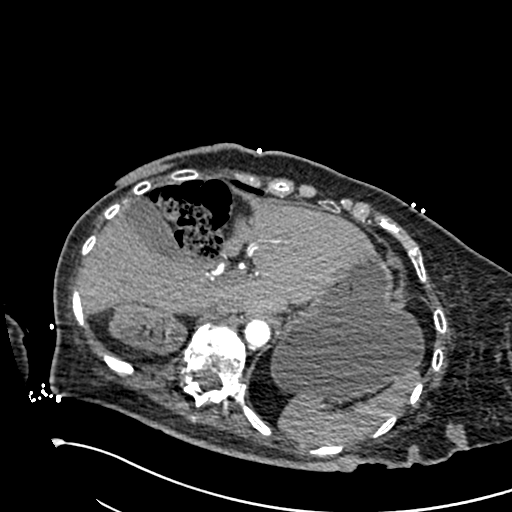
[im 108/368  lung]
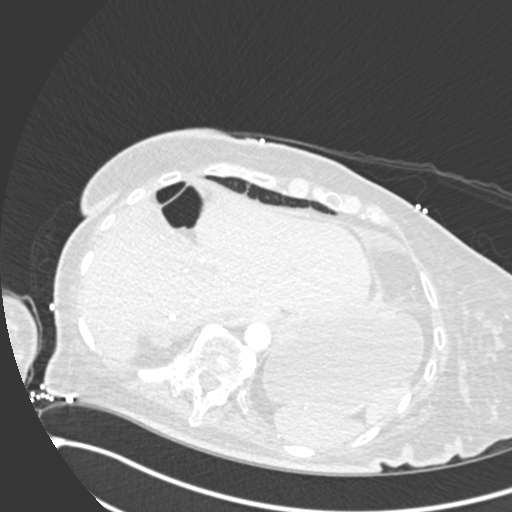
[im 138/368  soft-tissue]
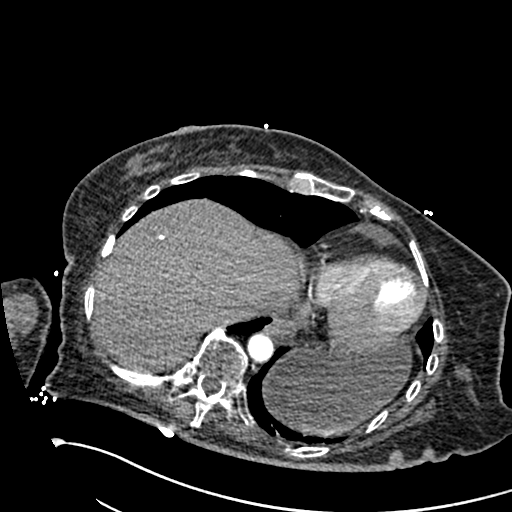
[im 153/368  lung]
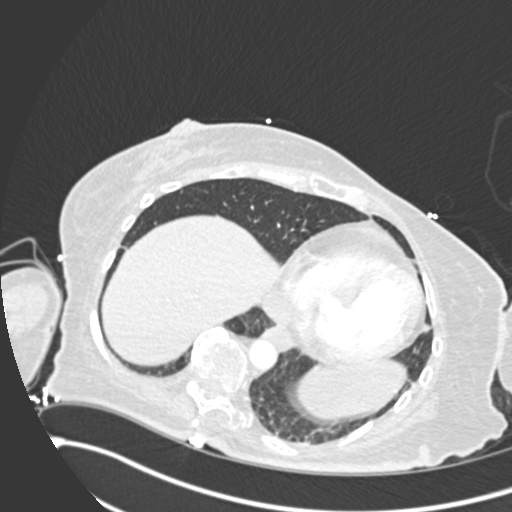
[im 184/368  soft-tissue]
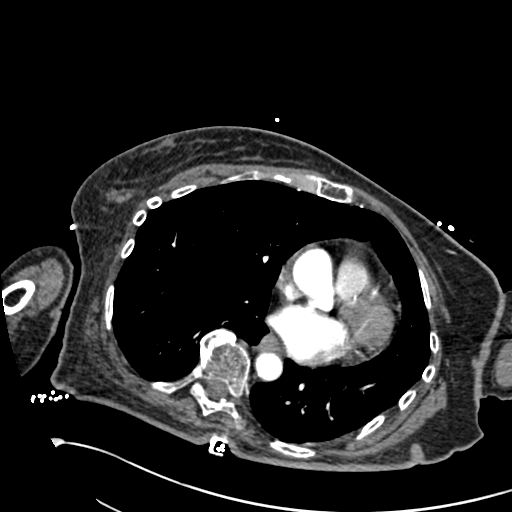
[im 215/368  lung]
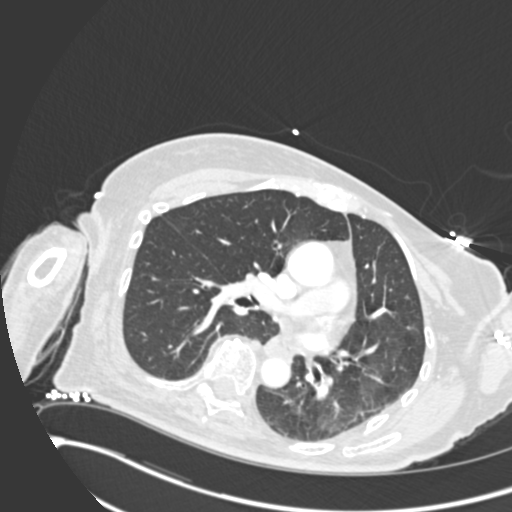
[im 230/368  soft-tissue]
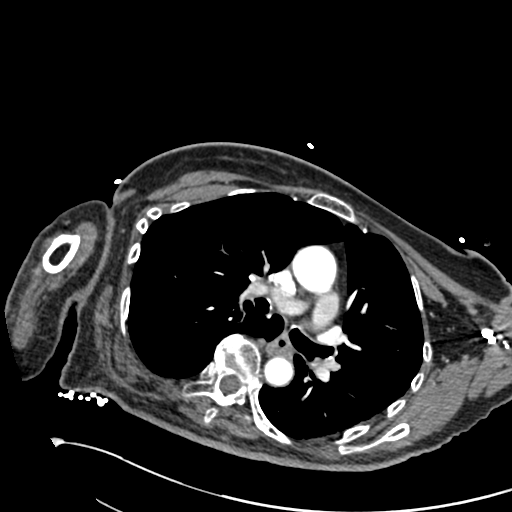
[im 260/368  lung]
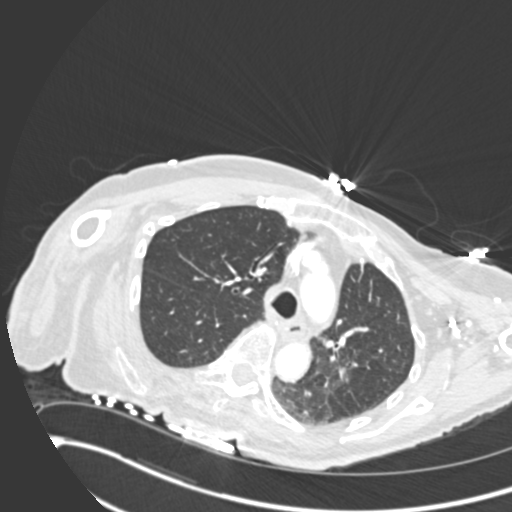
[im 276/368  soft-tissue]
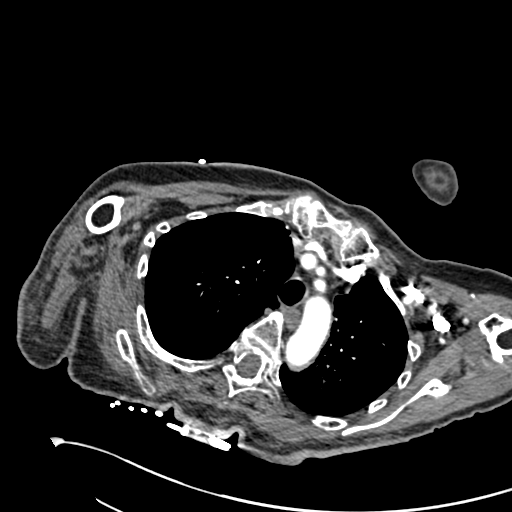
[im 306/368  lung]
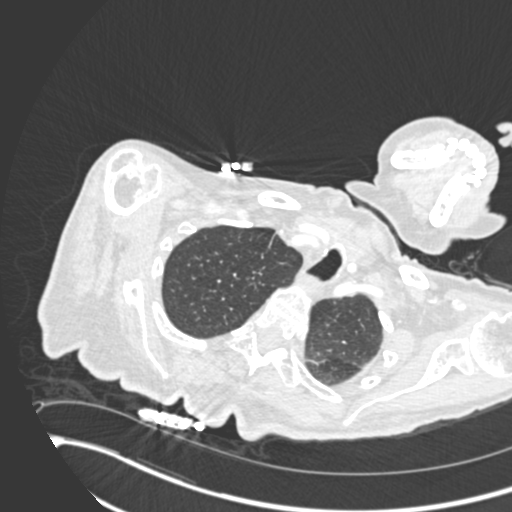
[im 322/368  soft-tissue]
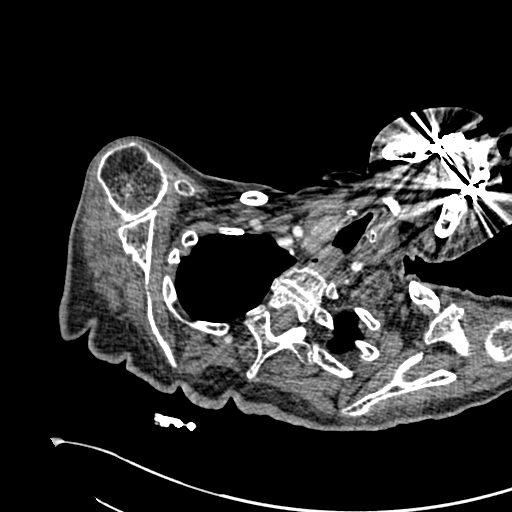
[im 352/368  lung]
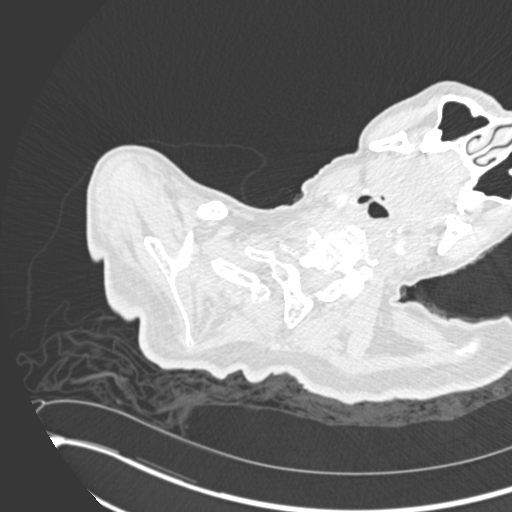

[Series 7: coronal mpr · coronal · 0.59mm/px · 3 of 80 slices shown]
[im 20/80  soft-tissue]
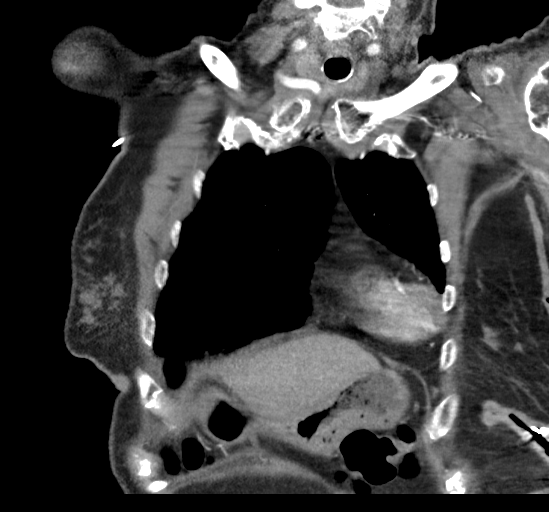
[im 40/80  soft-tissue]
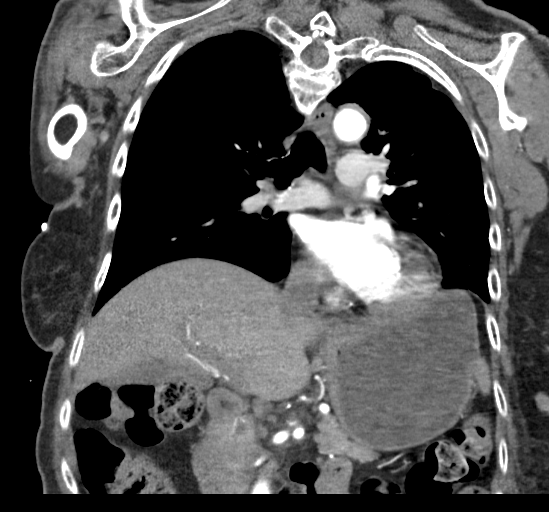
[im 60/80  soft-tissue]
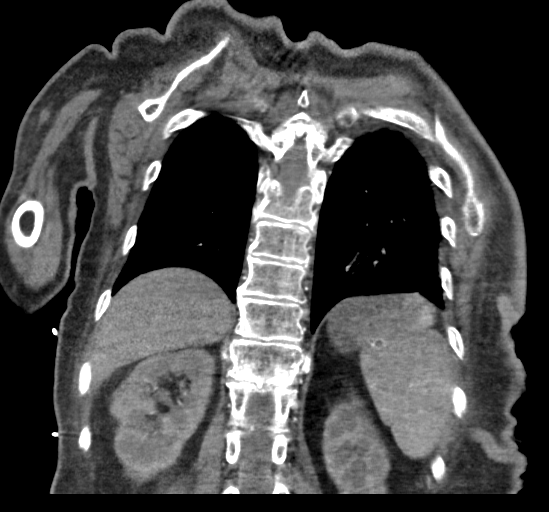

[18 of 46 positions shown; findings below may reference images not displayed]

FINDINGS: Evaluation of this exam is limited due to respiratory motion
artifact.

Cardiovascular: There is no cardiomegaly. Small pericardial effusion
measuring 9 mm in thickness anterior to the heart. There is coronary
vascular calcification. The thoracic aorta is unremarkable. The
origins of the great vessels of the aortic arch appear patent.
Evaluation of the pulmonary arteries is very limited due to
respiratory motion artifact and suboptimal opacification and timing
of the contrast. No definite large or central pulmonary artery
embolus identified.

Mediastinum/Nodes: No hilar or mediastinal adenopathy. The esophagus
is grossly unremarkable. No mediastinal fluid collection.

Lungs/Pleura: Diffuse hazy and streaky density throughout the left
lung may be related to atelectasis. Atypical infection is not
excluded. Clinical correlation is recommended. No lobar
consolidation, pleural effusion, or pneumothorax. The central
airways are patent.

Upper Abdomen: Small scattered calcified liver granuloma.

Musculoskeletal: Degenerative changes of the spine and osteopenia.
No acute osseous pathology.

Review of the MIP images confirms the above findings.
IMPRESSION: 1. No large central pulmonary artery embolus.
2. Diffuse hazy and streaky density throughout the left lung may be
related to atelectasis. Atypical infection is not excluded.
3. Small pericardial effusion.
4. Aortic Atherosclerosis (61XX2-DOS.S).

## 2023-06-12 IMAGING — DX DG CHEST 1V PORT
1 series · 1 of 1 positions shown · non-contrast
Comparison: Chest x-ray 04/15/2015

CLINICAL DATA: Seizure-like activity.  Altered mental status

EXAM:
PORTABLE CHEST 1 VIEW.  Patient is rotated.

[chest ap]
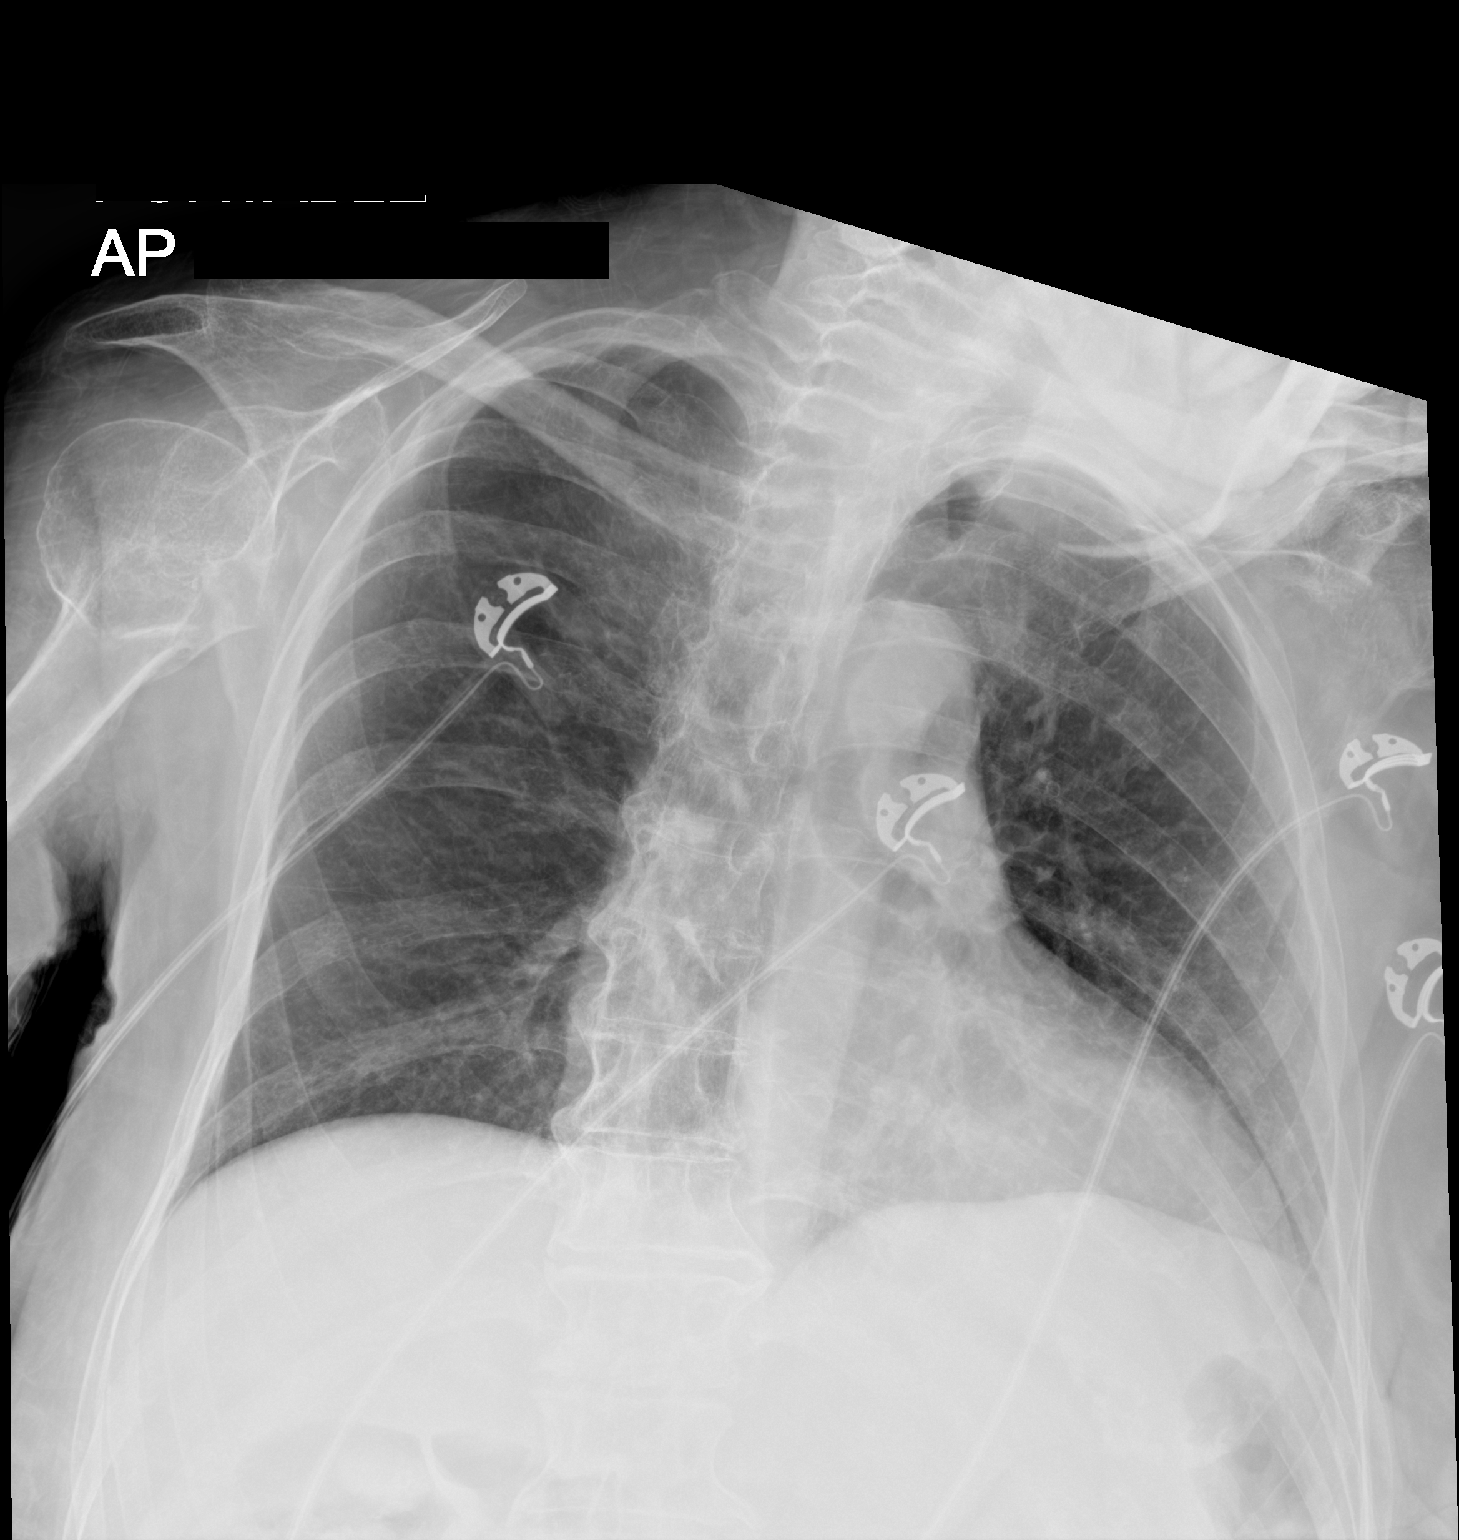

[1 of 1 positions shown; findings below may reference images not displayed]

FINDINGS: The heart and mediastinal contours are unchanged.

No focal consolidation. No pulmonary edema. No pleural effusion. No
pneumothorax.

No acute osseous abnormality. Likely old healed right proximal
humeral fracture.
IMPRESSION: No active disease.
